# Patient Record
Sex: Female | Born: 1970 | Race: White | Hispanic: No | Marital: Married | Smoking: Former smoker
Health system: Southern US, Community
[De-identification: ages and names within clinical notes are randomized; demographics above are authoritative.]

## PROBLEM LIST (undated history)

## (undated) DIAGNOSIS — R51 Headache: Secondary | ICD-10-CM

## (undated) DIAGNOSIS — F419 Anxiety disorder, unspecified: Secondary | ICD-10-CM

## (undated) DIAGNOSIS — F32A Depression, unspecified: Secondary | ICD-10-CM

## (undated) DIAGNOSIS — F329 Major depressive disorder, single episode, unspecified: Secondary | ICD-10-CM

## (undated) HISTORY — DX: Major depressive disorder, single episode, unspecified: F32.9

## (undated) HISTORY — DX: Depression, unspecified: F32.A

## (undated) HISTORY — PX: TONSILLECTOMY: SUR1361

## (undated) HISTORY — DX: Headache: R51

## (undated) HISTORY — DX: Anxiety disorder, unspecified: F41.9

---

## 2006-10-09 ENCOUNTER — Inpatient Hospital Stay (HOSPITAL_COMMUNITY): Admission: AD | Admit: 2006-10-09 | Discharge: 2006-10-12 | Payer: Self-pay | Admitting: Obstetrics and Gynecology

## 2010-12-25 LAB — CBC
HCT: 31.2 — ABNORMAL LOW
HCT: 38.7
HCT: 38.9
MCHC: 34.1
MCHC: 34.4
MCV: 86.8
MCV: 85.6
MCV: 86.1
Platelets: 229
Platelets: 257
Platelets: 272
RBC: 4.49
RDW: 14.7 — ABNORMAL HIGH
WBC: 9.9
WBC: 10.1
WBC: 11.2 — ABNORMAL HIGH

## 2010-12-25 LAB — COMPREHENSIVE METABOLIC PANEL
AST: 18
Albumin: 2.2 — ABNORMAL LOW
Calcium: 8.5
Creatinine, Ser: 0.5
GFR calc Af Amer: >60

## 2011-06-07 ENCOUNTER — Other Ambulatory Visit: Payer: Self-pay | Admitting: Obstetrics and Gynecology

## 2011-06-07 DIAGNOSIS — R928 Other abnormal and inconclusive findings on diagnostic imaging of breast: Secondary | ICD-10-CM

## 2011-06-11 ENCOUNTER — Ambulatory Visit
Admission: RE | Admit: 2011-06-11 | Discharge: 2011-06-11 | Disposition: A | Discharge status: Home / Self Care | Payer: BC Managed Care – PPO | Source: Ambulatory Visit | Attending: Obstetrics and Gynecology | Admitting: Obstetrics and Gynecology

## 2011-06-11 DIAGNOSIS — R928 Other abnormal and inconclusive findings on diagnostic imaging of breast: Secondary | ICD-10-CM

## 2012-04-30 ENCOUNTER — Ambulatory Visit (INDEPENDENT_AMBULATORY_CARE_PROVIDER_SITE_OTHER): Payer: BC Managed Care – PPO | Admitting: Physician Assistant

## 2012-04-30 ENCOUNTER — Encounter: Payer: Self-pay | Admitting: Physician Assistant

## 2012-04-30 VITALS — BP 133/76 | HR 103 | Ht 63.25 in | Wt 150.0 lb

## 2012-04-30 DIAGNOSIS — D2371 Other benign neoplasm of skin of right lower limb, including hip: Secondary | ICD-10-CM

## 2012-04-30 DIAGNOSIS — R110 Nausea: Secondary | ICD-10-CM

## 2012-04-30 DIAGNOSIS — D2370 Other benign neoplasm of skin of unspecified lower limb, including hip: Secondary | ICD-10-CM

## 2012-04-30 DIAGNOSIS — R109 Unspecified abdominal pain: Secondary | ICD-10-CM

## 2012-04-30 DIAGNOSIS — F32A Depression, unspecified: Secondary | ICD-10-CM

## 2012-04-30 DIAGNOSIS — F411 Generalized anxiety disorder: Secondary | ICD-10-CM

## 2012-04-30 DIAGNOSIS — R1900 Intra-abdominal and pelvic swelling, mass and lump, unspecified site: Secondary | ICD-10-CM

## 2012-04-30 DIAGNOSIS — F3289 Other specified depressive episodes: Secondary | ICD-10-CM

## 2012-04-30 DIAGNOSIS — R195 Other fecal abnormalities: Secondary | ICD-10-CM

## 2012-04-30 LAB — CBC WITH DIFFERENTIAL/PLATELET
Eosinophils Absolute: 0.3 10*3/uL (ref 0.0–0.7)
Hemoglobin: 15.8 g/dL — ABNORMAL HIGH (ref 12.0–15.0)
Lymphs Abs: 2.3 10*3/uL (ref 0.7–4.0)
MCH: 29 pg (ref 26.0–34.0)
Monocytes Relative: 7 % (ref 3–12)
Neutro Abs: 6.8 10*3/uL (ref 1.7–7.7)
Neutrophils Relative %: 66 % (ref 43–77)
Platelets: 253 10*3/uL (ref 150–400)
RBC: 5.44 MIL/uL — ABNORMAL HIGH (ref 3.87–5.11)
WBC: 10.2 10*3/uL (ref 4.0–10.5)

## 2012-04-30 MED ORDER — ALPRAZOLAM 1 MG PO TABS: ORAL_TABLET | ORAL | Status: DC

## 2012-04-30 NOTE — Progress Notes (Signed)
Subjective:    Patient ID: Becky Gibson, female    DOB: [birth year], 42 y.o.   MRN: [id number]  HPI Patient is a new pt that it here to establish. PMH is positive for anxiety.  Anxiety is daily and constant but does not take anything because does not like those meds. She will take an xanax every now and then when anxiety gets really bad. She has a lot of marital stresses with a husband who is very controlling. She does not have daily depression but is aware that there are some days where all she can do is cry.   She has been having abdominal cramps, nausea, and lumps in her abdomen. She is very scared that it is cancer. She has noticed the lumps for over a year but has not sought treatment. She feels like they have increased in numbers. They are not painful to touch but sometimes tender. Denies any fever, chills, muscle aches, fatigue. She has had ongoing loose stools but no blood or mucus associated. Most things go right threw her. She does have a family hx of colon cancer.   She has a mole on her leg she would like looked at. She shaved over it a couple of months ago but has been there for many years. It has not changed in size or color. It does not bleed regularly. Never had hx of skin cancer.   Last pap feb 2013. Last mammogram 2013.  Family/social/surgical hx reviewed.      Review of Systems  Constitutional: Negative.   HENT: Negative.   Eyes: Negative.   Respiratory: Negative.   Cardiovascular: Negative.   Endocrine: Negative.   Genitourinary: Negative.   Musculoskeletal: Negative.   Allergic/Immunologic: Negative.   Neurological: Negative.   Hematological: Negative.        Objective:   Physical Exam  Constitutional: She is oriented to person, place, and time. She appears well-developed and well-nourished.  HENT:  Head: Normocephalic and atraumatic.  Eyes: Conjunctivae are normal.  Neck: Normal range of motion. Neck supple.  Cardiovascular: Regular rhythm and normal heart  sounds.   Tachycardia at 103.  Pulmonary/Chest: Effort normal and breath sounds normal.  Abdominal: Soft. Bowel sounds are normal. There is no tenderness.  Tiny marble sizes lumps all throughout abdomen, not painful to touch.     Lymphadenopathy:    She has no cervical adenopathy.  Neurological: She is alert and oriented to person, place, and time.  Skin: Skin is warm.  2mm by 1mm purpulish circular harden papule with dimple sign on top of right thigh anterior.  Psychiatric:  Anxious.          Assessment & Plan:  GAD/Depression- GAD-7 19. PHQ-9 was 5. Discussed treatment with SSRI. Pt declined at this time. I think loose stools and abdominal cramping might also be anxiety issue. I discussed benefit of treatment. She will consider at follow up appt in 6 weeks. Refilled xanax to use prn.   Dermatofibroma- discussed lump was benign. Could remove if bother her but best to leave alone. Usually form from trauma. Still watch if any color changes or size could always follow up.   Abdominal cramping/loose stools/abdominal lumps- Could def be lipomas throughout abdomin but with cramping/loose stools and anxiety about cancer think we should do CT. Discussed likely lipomas pt still very concerned. Will order a CBC to look at white blood count. Symptoms could also be IBS. Pt aware and if CT normal will consider treatment for anxiety/IBS. Would consider anti-nausea  but pt did not want meds.  Follow up in 6 weeks.

## 2012-04-30 NOTE — Patient Instructions (Addendum)
Will get CT scan. Call if not scheduled in 2 days.   Need to consider anti-depressant but will revisit in 1 month.

## 2012-05-02 ENCOUNTER — Encounter: Payer: Self-pay | Admitting: Physician Assistant

## 2012-05-02 DIAGNOSIS — F411 Generalized anxiety disorder: Secondary | ICD-10-CM | POA: Insufficient documentation

## 2012-05-05 ENCOUNTER — Telehealth: Payer: Self-pay | Admitting: *Deleted

## 2012-05-05 NOTE — Telephone Encounter (Signed)
CT abdomen/pelvis with contrast per [employer] no pre-cert required. Barry Dienes, LPN

## 2012-05-07 ENCOUNTER — Ambulatory Visit (INDEPENDENT_AMBULATORY_CARE_PROVIDER_SITE_OTHER): Payer: BC Managed Care – PPO

## 2012-05-07 MED ORDER — IOHEXOL 300 MG/ML  SOLN
100.0000 mL | Freq: Once | INTRAMUSCULAR | Status: AC | PRN
  Administered 2012-05-07: 100 mL via INTRAVENOUS

## 2012-05-10 ENCOUNTER — Other Ambulatory Visit: Payer: Self-pay | Admitting: Physician Assistant

## 2012-05-10 MED ORDER — SERTRALINE HCL 50 MG PO TABS: ORAL_TABLET | ORAL | Status: DC

## 2012-07-09 ENCOUNTER — Ambulatory Visit (INDEPENDENT_AMBULATORY_CARE_PROVIDER_SITE_OTHER): Payer: BC Managed Care – PPO | Admitting: Physician Assistant

## 2012-07-09 ENCOUNTER — Encounter: Payer: Self-pay | Admitting: Physician Assistant

## 2012-07-09 VITALS — BP 125/71 | HR 87 | Wt 161.0 lb

## 2012-07-09 DIAGNOSIS — Z131 Encounter for screening for diabetes mellitus: Secondary | ICD-10-CM

## 2012-07-09 DIAGNOSIS — E663 Overweight: Secondary | ICD-10-CM

## 2012-07-09 DIAGNOSIS — E0789 Other specified disorders of thyroid: Secondary | ICD-10-CM

## 2012-07-09 DIAGNOSIS — F411 Generalized anxiety disorder: Secondary | ICD-10-CM

## 2012-07-09 DIAGNOSIS — Z6825 Body mass index (BMI) 25.0-25.9, adult: Secondary | ICD-10-CM

## 2012-07-09 DIAGNOSIS — Z13220 Encounter for screening for lipoid disorders: Secondary | ICD-10-CM

## 2012-07-09 MED ORDER — PHENTERMINE HCL 37.5 MG PO CAPS: 37.5000 mg | ORAL_CAPSULE | ORAL | Status: DC

## 2012-07-09 MED ORDER — SERTRALINE HCL 50 MG PO TABS: ORAL_TABLET | ORAL | Status: DC

## 2012-07-09 NOTE — Progress Notes (Signed)
  Subjective:    Patient ID: Becky Gibson, female    DOB: 16-Jul-1970, 42 y.o.   MRN: [id number]  HPI Patient presents to the clinic to follow up on anxiety and GI concerns. She is doing 100 percent better today. Per patient she feels great and has no anxiety or depression. She even stopped smoking on march 5th. She graduates on may 8th. She is going through a divorce but on medications she is able to deal with all the stress. Her diarrhea and stomach bloating is 100 percent better.   She has noticed she has started to gain some weight back. In the past she was put on phentermine and she tolerated well. Denies any CP, palpiations, SOB or intolerance with appetitie suppressant.   Review of Systems     Objective:   Physical Exam  Constitutional: She is oriented to person, place, and time. She appears well-developed and well-nourished.  HENT:  Head: Normocephalic and atraumatic.  Eyes: Conjunctivae are normal.  Neck: Normal range of motion. Neck supple.  Bilateral fullness around thyroid. Per pt been present for many years.   Cardiovascular: Normal rate, regular rhythm and normal heart sounds.   Pulmonary/Chest: Effort normal and breath sounds normal. She has no wheezes.  Neurological: She is alert and oriented to person, place, and time.  Skin: Skin is warm and dry.  Psychiatric: She has a normal mood and affect. Her behavior is normal.          Assessment & Plan:  Anxiety- GAD-7 was 0. Refilled zoloft. Follow up in 6 months.   Thyroid fullness- will check TSH. Will follow up in 1 month as CPE. May consider ultrasound at that time.   Obesity- Will give phentermine. Recheck in 1 month.  Needs CPE. Gave lab slip.

## 2012-07-09 NOTE — Patient Instructions (Signed)
Needs CPE. Follow up in 1 month.   Continue zoloft.

## 2012-08-13 ENCOUNTER — Encounter: Payer: Self-pay | Admitting: Physician Assistant

## 2012-08-13 ENCOUNTER — Ambulatory Visit (INDEPENDENT_AMBULATORY_CARE_PROVIDER_SITE_OTHER): Payer: BC Managed Care – PPO | Admitting: Physician Assistant

## 2012-08-13 VITALS — BP 116/82 | HR 97 | Wt 160.0 lb

## 2012-08-13 DIAGNOSIS — R635 Abnormal weight gain: Secondary | ICD-10-CM

## 2012-08-13 DIAGNOSIS — L708 Other acne: Secondary | ICD-10-CM

## 2012-08-13 DIAGNOSIS — L709 Acne, unspecified: Secondary | ICD-10-CM | POA: Insufficient documentation

## 2012-08-13 MED ORDER — PHENTERMINE HCL 37.5 MG PO CAPS: 37.5000 mg | ORAL_CAPSULE | ORAL | Status: DC

## 2012-08-13 MED ORDER — DAPSONE 5 % EX GEL: 1.0000 "application " | Freq: Two times a day (BID) | CUTANEOUS | Status: DC

## 2012-08-13 NOTE — Progress Notes (Signed)
  Subjective:    Patient ID: Becky Gibson, female    DOB: [birth year], 42 y.o.   MRN: [id number]  HPI Patient is a 42 year old female who presents to the clinic for med refills.  Patient started phentermine last month. She has only lost 1 pound since starting medication. She admits to not exercising and not doing everything she could to diet. She still would have occasional soda and eats out fast food. She does feel like it has helped to decrease her appetite. She is still able to sleep and denies any chest pains, palpitations, headaches. She is concerned that is not working as well as it has in the past.  She also has some cystic acne spots that she would like some cream. She's used her daughter's aczone and has worked very well. She uses a combination of antibacterial and Dove soap. She has always had acne but seems to be able to control it with washing her face. Lately feels like she is having more cystic bumps. She admits to trying to pop cystic comedone herself.   Review of Systems     Objective:   Physical Exam  Constitutional: She is oriented to person, place, and time. She appears well-developed and well-nourished.  HENT:  Head: Normocephalic and atraumatic.  Cardiovascular: Normal rate, regular rhythm and normal heart sounds.   Pulmonary/Chest: Effort normal and breath sounds normal.  Neurological: She is alert and oriented to person, place, and time.  Skin:  3 comodomes that appear to be in the cycle of healing on chin and forehead.   Psychiatric: She has a normal mood and affect. Her behavior is normal.          Assessment & Plan:  Abnormal weight gain- Refilled phentermine. Discussed with patient that would help the most in combination with diet and exercise. Encouraged patient to count calories and start every day walking or exercise regimen. Followup in one month.  Acne- started aczone. Could be too expensive and there are other things to try. Made pt aware but since she  likes so much wanted to try this first. Continue to keep face clean. Look for triggers such as stress. Make sure wear sunscreen that doesn't clog pores.

## 2013-01-29 ENCOUNTER — Other Ambulatory Visit (HOSPITAL_COMMUNITY)
Admission: RE | Admit: 2013-01-29 | Discharge: 2013-01-29 | Disposition: A | Discharge status: Home / Self Care | Payer: BC Managed Care – PPO | Source: Ambulatory Visit | Attending: Family Medicine | Admitting: Family Medicine

## 2013-01-29 ENCOUNTER — Encounter: Payer: Self-pay | Admitting: Physician Assistant

## 2013-01-29 ENCOUNTER — Ambulatory Visit (INDEPENDENT_AMBULATORY_CARE_PROVIDER_SITE_OTHER): Payer: BC Managed Care – PPO | Admitting: Physician Assistant

## 2013-01-29 VITALS — BP 139/60 | HR 95 | Wt 179.0 lb

## 2013-01-29 DIAGNOSIS — Z1151 Encounter for screening for human papillomavirus (HPV): Secondary | ICD-10-CM | POA: Insufficient documentation

## 2013-01-29 DIAGNOSIS — Z01419 Encounter for gynecological examination (general) (routine) without abnormal findings: Secondary | ICD-10-CM | POA: Insufficient documentation

## 2013-01-29 DIAGNOSIS — R1011 Right upper quadrant pain: Secondary | ICD-10-CM

## 2013-01-29 DIAGNOSIS — Z0000 Encounter for general adult medical examination without abnormal findings: Secondary | ICD-10-CM

## 2013-01-29 DIAGNOSIS — Z23 Encounter for immunization: Secondary | ICD-10-CM

## 2013-01-29 DIAGNOSIS — R8290 Unspecified abnormal findings in urine: Secondary | ICD-10-CM

## 2013-01-29 DIAGNOSIS — Z131 Encounter for screening for diabetes mellitus: Secondary | ICD-10-CM

## 2013-01-29 DIAGNOSIS — R1012 Left upper quadrant pain: Secondary | ICD-10-CM

## 2013-01-29 DIAGNOSIS — E049 Nontoxic goiter, unspecified: Secondary | ICD-10-CM

## 2013-01-29 DIAGNOSIS — Z1239 Encounter for other screening for malignant neoplasm of breast: Secondary | ICD-10-CM

## 2013-01-29 DIAGNOSIS — R82998 Other abnormal findings in urine: Secondary | ICD-10-CM

## 2013-01-29 DIAGNOSIS — Z13220 Encounter for screening for lipoid disorders: Secondary | ICD-10-CM

## 2013-01-29 DIAGNOSIS — D171 Benign lipomatous neoplasm of skin and subcutaneous tissue of trunk: Secondary | ICD-10-CM

## 2013-01-29 LAB — POCT URINALYSIS DIPSTICK
Bilirubin, UA: NEGATIVE
Glucose, UA: NEGATIVE
Leukocytes, UA: NEGATIVE
Nitrite, UA: NEGATIVE
Protein, UA: NEGATIVE
Spec Grav, UA: 1.02
pH, UA: 7

## 2013-01-29 MED ORDER — OMEPRAZOLE 40 MG PO CPDR: 40.0000 mg | DELAYED_RELEASE_CAPSULE | Freq: Every day | ORAL | Status: DC

## 2013-01-29 MED ORDER — SERTRALINE HCL 50 MG PO TABS: ORAL_TABLET | ORAL | Status: DC

## 2013-01-29 MED ORDER — ALPRAZOLAM 1 MG PO TABS: ORAL_TABLET | ORAL | Status: DC

## 2013-01-29 NOTE — Progress Notes (Signed)
Subjective:     Becky Gibson is a 42 y.o. female and is here for a comprehensive physical exam. The patient reports no problems.  Anxiety- well controlled on Zoloft with occasional use of Xanax. Becky Gibson Zoloft helped her marriage together. She was in a dark place and consider maybe her husband and now they're together and better than ever.  Patient is having ongoing abdominal issues. She has had them on and off for the last 3 years. She has these small nodules in her abdomen that when touched become very painful. She was told there fatty tissue from weight loss. We did a CT in office and was negative. This is very concerning to her. Recently she's had a lot of burning in her upper GI area. When she sits down to the her stomach will burn to the point she cannot eat anymore. She is not aware of any foods that make this worse. She has not vomited. She has not taken anything to make better.  Patient does have an abnormal urine odor today. She denies any dysuria, urinary frequency or urinary urgency. Patient has not had a fever, chills, low back pain.   History   Social History  . Marital Status: Married    Spouse Name: N/A    Number of Children: N/A  . Years of Education: N/A   Occupational History  . Not on file.   Social History Main Topics  . Smoking status: Current Every Day Smoker  . Smokeless tobacco: Never Used  . Alcohol Use: No  . Drug Use: No  . Sexual Activity: Yes    Birth Control/ Protection: None   Other Topics Concern  . Not on file   Social History Narrative  . No narrative on file   Health Maintenance  Topic Date Due  . Tetanus/tdap  09/12/1987  . Influenza Vaccine  10/10/2012  . Pap Smear  01/30/2016    The following portions of the patient's history were reviewed and updated as appropriate: allergies, current medications, past family history, past medical history, past social history, past surgical history and problem list.  Review of Systems Pertinent  items are noted in HPI.   Objective:    BP 139/60  Pulse 95  Wt 179 lb (81.194 kg)  LMP 01/12/2013 General appearance: alert, cooperative and appears stated age Head: Normocephalic, without obvious abnormality, atraumatic Eyes: conjunctivae/corneas clear. PERRL, EOM's intact. Fundi benign. Ears: normal TM's and external ear canals both ears and bilateral TM scarring present. no erythema, blood or pus. Nose: Nares normal. Septum midline. Mucosa normal. No drainage or sinus tenderness. Throat: lips, mucosa, and tongue normal; teeth and gums normal Neck: no adenopathy, no carotid bruit, no JVD, supple, symmetrical, trachea midline and thyroid goiter/nodule felt on left side of trachea.  Back: symmetric, no curvature. ROM normal. No CVA tenderness. Lungs: clear to auscultation bilaterally Breasts: right breast normal appearance, masses, or tenderness. Left breast was very tender but no masses.  Heart: regular rate and rhythm, S1, S2 normal, no murmur, click, rub or gallop Abdomen: generalized tenderness over entire abdomen. More tenderness over upper quadrants to palpations. Small nodules consistent for lipomas noted on bilateral sides of abdomen.  Pelvic: cervix normal in appearance, external genitalia normal, no adnexal masses or tenderness, no cervical motion tenderness, rectovaginal septum normal, uterus normal size, shape, and consistency and vagina normal without discharge Extremities: extremities normal, atraumatic, no cyanosis or edema Pulses: 2+ and symmetric Skin: Skin color, texture, turgor normal. No rashes or lesions Lymph  nodes: Cervical, supraclavicular, and axillary nodes normal. Neurologic: Grossly normal    Assessment:    Healthy female exam.      Plan:    CPE- will order mammogram. Tdap given today. Flu shot denied. Pap done today and will call with results. Encouraged regular exercise for weight loss since gained weight over past 6 months. Vitamin D and calcium  recommended.   Upper abdominal pain/lipomas- Will test for h.pylori. Will start PPI for 4 weeks. Will get abdominal ultrasound for further evaluate nodules. Will get CBC to look for any WBC elevation. Discussed with patient I think nodules are lipomas. She is not convinced and wants further testing because they are so painful. Previous CT of abdomen done and normal.   Urine odor- UA negative for blood, nitrates, leukocytes. We'll send for culture. Reassured patient today. She has a symptom she was instructed to call office for followup.  Thyroid goiter, left side- Will get neck ultrasound and check TSH levels.   Anxiety- well controlled on zoloft and wants refill today.    See After Visit Summary for Counseling Recommendations

## 2013-01-29 NOTE — Patient Instructions (Addendum)
Will get ultrasound of thyroid and abdomen.    Keeping You Healthy  Get These Tests 1. Blood Pressure- Have your blood pressure checked once a year by your health care provider.  Normal blood pressure is 120/80. 2. Weight- Have your body mass index (BMI) calculated to screen for obesity.  BMI is measure of body fat based on height and weight.  You can also calculate your own BMI at https://www.west-esparza.com/. 3. Cholesterol- Have your cholesterol checked every 5 years starting at age 62 then yearly starting at age 54. 4. Chlamydia, HIV, and other sexually transmitted diseases- Get screened every year until age 53, then within three months of each new sexual provider. 5. Pap Smear- Every 1-3 years; discuss with your health care provider. 6. Mammogram- Every year starting at age 18  Take these medicines  Calcium with Vitamin D-Your body needs 1200 mg of Calcium each day and [phone] IU of Vitamin D daily.  Your body can only absorb 500 mg of Calcium at a time so Calcium must be taken in 2 or 3 divided doses throughout the day.  Multivitamin with folic acid- Once daily if it is possible for you to become pregnant.  Get these Immunizations  Gardasil-Series of three doses; prevents HPV related illness such as genital warts and cervical cancer.  Menactra-Single dose; prevents meningitis.  Tetanus shot- Every 10 years.  Flu shot-Every year.  Take these steps 1. Do not smoke-Your healthcare provider can help you quit.  For tips on how to quit go to www.smokefree.gov or call 1-800 QUITNOW. 2. Be physically active- Exercise 5 days a week for at least 30 minutes.  If you are not already physically active, start slow and gradually work up to 30 minutes of moderate physical activity.  Examples of moderate activity include walking briskly, dancing, swimming, bicycling, etc. 3. Breast Cancer- A self breast exam every month is important for early detection of breast cancer.  For more information and  instruction on self breast exams, ask your healthcare provider or [email or web address]. 4. Eat a healthy diet- Eat a variety of healthy foods such as fruits, vegetables, whole grains, low fat milk, low fat cheeses, yogurt, lean meats, poultry and fish, beans, nuts, tofu, etc.  For more information go to www. Thenutritionsource.org 5. Drink alcohol in moderation- Limit alcohol intake to one drink or less per day. Never drink and drive. 6. Depression- Your emotional health is as important as your physical health.  If you're feeling down or losing interest in things you normally enjoy please talk to your healthcare provider about being screened for depression. 7. Dental visit- Brush and floss your teeth twice daily; visit your dentist twice a year. 8. Eye doctor- Get an eye exam at least every 2 years. 9. Helmet use- Always wear a helmet when riding a bicycle, motorcycle, rollerblading or skateboarding. 10. Safe sex- If you may be exposed to sexually transmitted infections, use a condom. 11. Seat belts- Seat belts can save your live; always wear one. 12. Smoke/Carbon Monoxide detectors- These detectors need to be installed on the appropriate level of your home. Replace batteries at least once a year. 13. Skin cancer- When out in the sun please cover up and use sunscreen 15 SPF or higher. 14. Violence- If anyone is threatening or hurting you, please tell your healthcare provider.

## 2013-01-30 LAB — LIPID PANEL
HDL: 43 mg/dL (ref >39)
LDL Cholesterol: 112 mg/dL — ABNORMAL HIGH (ref 0–99)
Total CHOL/HDL Ratio: 4.4 Ratio

## 2013-01-30 LAB — COMPLETE METABOLIC PANEL WITH GFR
ALT: 16 U/L (ref 0–35)
Albumin: 4 g/dL (ref 3.5–5.2)
Alkaline Phosphatase: 62 U/L (ref 39–117)
CO2: 28 mEq/L (ref 19–32)
GFR, Est African American: >89 mL/min
Glucose, Bld: 76 mg/dL (ref 70–99)
Potassium: 4.2 mEq/L (ref 3.5–5.3)
Sodium: 140 mEq/L (ref 135–145)
Total Bilirubin: 0.3 mg/dL (ref 0.3–1.2)
Total Protein: 6.3 g/dL (ref 6.0–8.3)

## 2013-01-30 LAB — CBC WITH DIFFERENTIAL/PLATELET
Basophils Absolute: 0 10*3/uL (ref 0.0–0.1)
Basophils Relative: 1 % (ref 0–1)
Lymphocytes Relative: 29 % (ref 12–46)
MCHC: 33.3 g/dL (ref 30.0–36.0)
Neutro Abs: 5.2 10*3/uL (ref 1.7–7.7)
Neutrophils Relative %: 59 % (ref 43–77)
RDW: 14.1 % (ref 11.5–15.5)
WBC: 8.7 10*3/uL (ref 4.0–10.5)

## 2013-01-30 LAB — TSH: TSH: 0.631 u[IU]/mL (ref 0.350–4.500)

## 2013-01-30 LAB — LIPASE: Lipase: 14 U/L (ref 0–75)

## 2013-01-30 LAB — H. PYLORI ANTIBODY, IGG: H Pylori IgG: <0.4 {ISR}

## 2013-02-02 ENCOUNTER — Other Ambulatory Visit: Payer: Self-pay | Admitting: Physician Assistant

## 2013-02-02 ENCOUNTER — Telehealth: Payer: Self-pay | Admitting: *Deleted

## 2013-02-02 DIAGNOSIS — N6310 Unspecified lump in the right breast, unspecified quadrant: Secondary | ICD-10-CM

## 2013-02-02 LAB — URINE CULTURE: Colony Count: >=100000

## 2013-02-02 MED ORDER — CIPROFLOXACIN HCL 500 MG PO TABS: 500.0000 mg | ORAL_TABLET | Freq: Two times a day (BID) | ORAL | Status: DC

## 2013-02-03 ENCOUNTER — Ambulatory Visit: Payer: BC Managed Care – PPO

## 2013-02-03 ENCOUNTER — Ambulatory Visit (INDEPENDENT_AMBULATORY_CARE_PROVIDER_SITE_OTHER): Payer: BC Managed Care – PPO

## 2013-02-03 DIAGNOSIS — E041 Nontoxic single thyroid nodule: Secondary | ICD-10-CM

## 2013-02-03 DIAGNOSIS — R1011 Right upper quadrant pain: Secondary | ICD-10-CM

## 2013-02-03 DIAGNOSIS — D171 Benign lipomatous neoplasm of skin and subcutaneous tissue of trunk: Secondary | ICD-10-CM

## 2013-02-03 DIAGNOSIS — E049 Nontoxic goiter, unspecified: Secondary | ICD-10-CM

## 2013-02-04 ENCOUNTER — Other Ambulatory Visit: Payer: Self-pay | Admitting: Physician Assistant

## 2013-02-04 ENCOUNTER — Encounter: Payer: Self-pay | Admitting: Physician Assistant

## 2013-02-04 DIAGNOSIS — E041 Nontoxic single thyroid nodule: Secondary | ICD-10-CM | POA: Insufficient documentation

## 2013-02-04 DIAGNOSIS — E010 Iodine-deficiency related diffuse (endemic) goiter: Secondary | ICD-10-CM

## 2013-02-09 ENCOUNTER — Other Ambulatory Visit: Payer: Self-pay | Admitting: Physician Assistant

## 2013-02-09 DIAGNOSIS — E042 Nontoxic multinodular goiter: Secondary | ICD-10-CM

## 2013-02-12 ENCOUNTER — Ambulatory Visit
Admission: RE | Admit: 2013-02-12 | Discharge: 2013-02-12 | Disposition: A | Discharge status: Home / Self Care | Payer: BC Managed Care – PPO | Source: Ambulatory Visit | Attending: Physician Assistant | Admitting: Physician Assistant

## 2013-02-12 ENCOUNTER — Other Ambulatory Visit: Payer: Self-pay | Admitting: Physician Assistant

## 2013-02-12 ENCOUNTER — Other Ambulatory Visit (HOSPITAL_COMMUNITY)
Admission: RE | Admit: 2013-02-12 | Discharge: 2013-02-12 | Disposition: A | Discharge status: Home / Self Care | Payer: BC Managed Care – PPO | Source: Ambulatory Visit | Attending: Interventional Radiology | Admitting: Interventional Radiology

## 2013-02-12 DIAGNOSIS — E042 Nontoxic multinodular goiter: Secondary | ICD-10-CM

## 2013-02-12 DIAGNOSIS — E041 Nontoxic single thyroid nodule: Secondary | ICD-10-CM | POA: Insufficient documentation

## 2013-02-16 ENCOUNTER — Other Ambulatory Visit: Payer: Self-pay | Admitting: Physician Assistant

## 2013-02-16 DIAGNOSIS — D497 Neoplasm of unspecified behavior of endocrine glands and other parts of nervous system: Secondary | ICD-10-CM

## 2013-02-16 DIAGNOSIS — E041 Nontoxic single thyroid nodule: Secondary | ICD-10-CM

## 2013-02-17 ENCOUNTER — Telehealth: Payer: Self-pay | Admitting: *Deleted

## 2013-02-17 NOTE — Telephone Encounter (Signed)
Pt called & requested her endocrinology referral be sent to Dr. Dorisann Frames in [place].  Her sister goes there & this dr has all the family hx. Lamont Snowball all the info to send new referral. Just an FYI.

## 2013-02-20 ENCOUNTER — Other Ambulatory Visit: Payer: BC Managed Care – PPO

## 2013-03-02 ENCOUNTER — Other Ambulatory Visit: Payer: BC Managed Care – PPO

## 2013-04-01 ENCOUNTER — Telehealth: Payer: Self-pay | Admitting: *Deleted

## 2013-04-01 MED ORDER — METAXALONE 800 MG PO TABS: 800.0000 mg | ORAL_TABLET | Freq: Three times a day (TID) | ORAL | Status: DC

## 2013-04-01 NOTE — Telephone Encounter (Signed)
Pt calls today asking if you would be willing to rx skelaxin 800mg  1 tab TID & Norco 7.25-325mg  q6 prn for her back pain, muscle spasms daily, groin pain, muscle pains in her upper legs. She states that back in sept she went to the ED & was rx these.

## 2013-04-01 NOTE — Telephone Encounter (Signed)
Ok for skelaxin 800mg  TID #90 no refills. I cannot send narcotic without appt and evaluation. Will need to make appt.

## 2013-04-01 NOTE — Telephone Encounter (Signed)
Notified pt & sent skelaxin 800mg  #90 to [facility].  She said she is waiting to hear about her nuclear scan on her thyroid.

## 2013-04-06 ENCOUNTER — Other Ambulatory Visit (HOSPITAL_COMMUNITY): Payer: Self-pay | Admitting: Endocrinology

## 2013-04-06 DIAGNOSIS — E041 Nontoxic single thyroid nodule: Secondary | ICD-10-CM

## 2013-04-23 ENCOUNTER — Ambulatory Visit (HOSPITAL_COMMUNITY)
Admission: RE | Admit: 2013-04-23 | Discharge: 2013-04-23 | Disposition: A | Discharge status: Home / Self Care | Payer: BC Managed Care – PPO | Source: Ambulatory Visit | Attending: Endocrinology | Admitting: Endocrinology

## 2013-04-23 DIAGNOSIS — E042 Nontoxic multinodular goiter: Secondary | ICD-10-CM | POA: Insufficient documentation

## 2013-04-23 DIAGNOSIS — E041 Nontoxic single thyroid nodule: Secondary | ICD-10-CM

## 2013-04-23 MED ORDER — SODIUM PERTECHNETATE TC 99M INJECTION
10.6000 | Freq: Once | INTRAVENOUS | Status: AC | PRN
  Administered 2013-04-23: 11 via INTRAVENOUS

## 2013-04-30 ENCOUNTER — Encounter (HOSPITAL_COMMUNITY): Payer: BC Managed Care – PPO

## 2013-05-01 ENCOUNTER — Encounter (HOSPITAL_COMMUNITY): Payer: BC Managed Care – PPO

## 2013-09-16 ENCOUNTER — Ambulatory Visit (INDEPENDENT_AMBULATORY_CARE_PROVIDER_SITE_OTHER): Payer: BC Managed Care – PPO | Admitting: Physician Assistant

## 2013-09-16 ENCOUNTER — Encounter: Payer: Self-pay | Admitting: Physician Assistant

## 2013-09-16 ENCOUNTER — Ambulatory Visit (INDEPENDENT_AMBULATORY_CARE_PROVIDER_SITE_OTHER): Payer: BC Managed Care – PPO

## 2013-09-16 VITALS — BP 120/79 | HR 96 | Ht 63.25 in | Wt 191.0 lb

## 2013-09-16 DIAGNOSIS — M5137 Other intervertebral disc degeneration, lumbosacral region: Secondary | ICD-10-CM

## 2013-09-16 DIAGNOSIS — R109 Unspecified abdominal pain: Secondary | ICD-10-CM

## 2013-09-16 DIAGNOSIS — M545 Low back pain, unspecified: Secondary | ICD-10-CM

## 2013-09-16 DIAGNOSIS — H539 Unspecified visual disturbance: Secondary | ICD-10-CM

## 2013-09-16 DIAGNOSIS — G8929 Other chronic pain: Secondary | ICD-10-CM

## 2013-09-16 DIAGNOSIS — D1739 Benign lipomatous neoplasm of skin and subcutaneous tissue of other sites: Secondary | ICD-10-CM

## 2013-09-16 DIAGNOSIS — R609 Edema, unspecified: Secondary | ICD-10-CM

## 2013-09-16 DIAGNOSIS — D171 Benign lipomatous neoplasm of skin and subcutaneous tissue of trunk: Secondary | ICD-10-CM

## 2013-09-16 DIAGNOSIS — M6240 Contracture of muscle, unspecified site: Secondary | ICD-10-CM

## 2013-09-16 DIAGNOSIS — F411 Generalized anxiety disorder: Secondary | ICD-10-CM

## 2013-09-16 DIAGNOSIS — M62838 Other muscle spasm: Secondary | ICD-10-CM

## 2013-09-16 DIAGNOSIS — R600 Localized edema: Secondary | ICD-10-CM

## 2013-09-16 MED ORDER — SERTRALINE HCL 100 MG PO TABS: 100.0000 mg | ORAL_TABLET | Freq: Every day | ORAL | Status: DC

## 2013-09-16 MED ORDER — HYDROCHLOROTHIAZIDE 12.5 MG PO TABS: ORAL_TABLET | ORAL | Status: DC

## 2013-09-16 MED ORDER — OMEPRAZOLE 40 MG PO CPDR: 40.0000 mg | DELAYED_RELEASE_CAPSULE | Freq: Every day | ORAL | Status: DC

## 2013-09-16 MED ORDER — ALPRAZOLAM 1 MG PO TABS: ORAL_TABLET | ORAL | Status: DC

## 2013-09-16 NOTE — Patient Instructions (Signed)
MRI of head.  Get lumbar xray of spine.  Increased zoloft to 100mg  daily.  HCTZ 1-2 tablets as needed for swelling.

## 2013-09-16 NOTE — Progress Notes (Signed)
Subjective:    Patient ID: Becky Gibson, female    DOB: 05/07/1970, 43 y.o.   MRN: [id number]  HPI Pt is a 43 yo female she presents to the clinic desperate to find answers for her ongoing symptoms. Symptoms have been ongoing since last September. She is going to the emergency room a few times and has seen a neurologist once. The neurologist ordered an MRI but never had done because they were asking for the money up front. She has had numerous diagnosis of restless leg syndrome, lipoma, thyroid nodules. She has had 2 thyroid nodules evaluated by endocrinologist and was given a clean bill of health for one year. Her symptoms are ongoing and seems to be worsening. She has pain all over her body. If she noticed any muscle it hurts and aches. Most of her pain is localized in her mid to low back and radiates around into her abdomen. She has small nodules in her abdomen that are very painful. These have been evaluated by myself and general surgery entire or lipomas. She has had a CT scan of abdomen and pelvis in 2014/March and negative for any causes of abdominal pain. She has had headaches almost every day. She has muscle contractions in her legs that she feels like "a baby is in her leg and trying to get out". She has been given Valium which helps some, ibuprofen and Skelaxin which does nothing. She does feel like her vision is becoming a little blurry. She does not want pain medication but wants to find out what is going on. She does not want to go back to the neurologist in [facility] but wants to be in network with someone in:. Her chronic symptoms are making her anxiety much worse. She went to ER on July 4th with panic attack. Her mother had ovarian cancer. She is scared something really bad is going on.    Review of Systems  All other systems reviewed and are negative.      Objective:   Physical Exam  Constitutional: She is oriented to person, place, and time. She appears well-developed and  well-nourished.  HENT:  Head: Normocephalic and atraumatic.  Cardiovascular: Normal rate, regular rhythm and normal heart sounds.   Pulmonary/Chest: Effort normal and breath sounds normal. She has no wheezes.  Neurological: She is alert and oriented to person, place, and time.  Skin: Skin is dry.  Psychiatric: She has a normal mood and affect. Her behavior is normal.  Upset and desperate to find out what is going on.           Assessment & Plan:  Anxiety/panic attacks- we did increase Zoloft today to 100 mg daily. Discussed with patient she could use Xanax for anxiety and panic attacks. Do not take with valium. Valium helps better with muscle spasm.   Muscle contractions- continue to use valium as needed. Discussed with pt there are other medications we could try such as gabapentin or requip for suspected RLS. Pt does not want to try any other medications. She prefers to undergo is going on and then proceed with medication.  Low back pain- patient has not had any imaging done of her back. Will get lumbar x-ray to evaluate for any disc compression or spinal abnormalities. Certainly continue to take ibuprofen up to 800 mg up to 3 times a day for ongoing back pain and discomfort.   Abdominal pain/lipoma-reassured patient that all of our exams only to lipoma and she's had a CT scan  done. At this time I do not want to proceed with any other imaging. Certainly she can consider a second opinion by another general surgeon. But it is unlikely they will take him out because they're so many in number.   Lower extremity edema-this only occurs periodically. She was given HCTZ to use 1-2 tabs as needed for lower extremity swelling. Discussed with patient this could be related to long periods of sitting or standing or salt intake. Elevation of feet is the best treatment for the leg swelling.  Overall discussed with patient that I am suspicious of fiber myalgia. However I feel like she needs a full  neurological/rheumatology will workup. I would like to refer her to a: Neurologist. Some of her symptoms sound suspicious for multiple sclerosis. I feel that she needs an evaluation before we go down the fibromyalgia pathway. She is on Zoloft which is 1 treatment for fibromyalgia like pain. She will continue on this and see if that helps with any of her symptoms.

## 2013-09-17 ENCOUNTER — Other Ambulatory Visit: Payer: Self-pay | Admitting: *Deleted

## 2013-09-17 DIAGNOSIS — H539 Unspecified visual disturbance: Secondary | ICD-10-CM | POA: Insufficient documentation

## 2013-09-17 DIAGNOSIS — R109 Unspecified abdominal pain: Secondary | ICD-10-CM | POA: Insufficient documentation

## 2013-09-17 DIAGNOSIS — G8929 Other chronic pain: Secondary | ICD-10-CM | POA: Insufficient documentation

## 2013-09-17 DIAGNOSIS — M545 Low back pain, unspecified: Secondary | ICD-10-CM | POA: Insufficient documentation

## 2013-09-17 MED ORDER — DIAZEPAM 5 MG PO TABS: 5.0000 mg | ORAL_TABLET | Freq: Three times a day (TID) | ORAL | Status: DC | PRN

## 2013-09-18 ENCOUNTER — Telehealth: Payer: Self-pay | Admitting: *Deleted

## 2013-09-18 NOTE — Telephone Encounter (Signed)
No auth required for MRI [id number] as per Cierra @ [facility]. Margette Fast, CMA

## 2013-09-26 ENCOUNTER — Ambulatory Visit (HOSPITAL_BASED_OUTPATIENT_CLINIC_OR_DEPARTMENT_OTHER)
Admission: RE | Admit: 2013-09-26 | Discharge: 2013-09-26 | Disposition: A | Discharge status: Home / Self Care | Payer: BC Managed Care – PPO | Source: Ambulatory Visit | Attending: Physician Assistant | Admitting: Physician Assistant

## 2013-09-26 DIAGNOSIS — M545 Low back pain, unspecified: Secondary | ICD-10-CM | POA: Insufficient documentation

## 2013-09-26 DIAGNOSIS — H539 Unspecified visual disturbance: Secondary | ICD-10-CM | POA: Insufficient documentation

## 2013-09-26 DIAGNOSIS — G8929 Other chronic pain: Secondary | ICD-10-CM | POA: Insufficient documentation

## 2013-09-26 DIAGNOSIS — J320 Chronic maxillary sinusitis: Secondary | ICD-10-CM | POA: Insufficient documentation

## 2013-09-26 DIAGNOSIS — M6240 Contracture of muscle, unspecified site: Secondary | ICD-10-CM

## 2013-09-26 DIAGNOSIS — M62838 Other muscle spasm: Secondary | ICD-10-CM | POA: Insufficient documentation

## 2013-09-26 MED ORDER — GADOBENATE DIMEGLUMINE 529 MG/ML IV SOLN: 15.0000 mL | Freq: Once | INTRAVENOUS | Status: AC | PRN

## 2013-09-28 ENCOUNTER — Other Ambulatory Visit: Payer: Self-pay | Admitting: Physician Assistant

## 2013-09-28 MED ORDER — AZITHROMYCIN 250 MG PO TABS: ORAL_TABLET | ORAL | Status: DC

## 2013-09-29 ENCOUNTER — Other Ambulatory Visit: Payer: Self-pay | Admitting: Physician Assistant

## 2013-09-29 MED ORDER — GABAPENTIN 300 MG PO CAPS: ORAL_CAPSULE | ORAL | Status: DC

## 2013-10-14 ENCOUNTER — Ambulatory Visit (INDEPENDENT_AMBULATORY_CARE_PROVIDER_SITE_OTHER): Payer: BC Managed Care – PPO | Admitting: Neurology

## 2013-10-14 ENCOUNTER — Ambulatory Visit: Payer: BC Managed Care – PPO | Admitting: Neurology

## 2013-10-14 ENCOUNTER — Encounter: Payer: Self-pay | Admitting: Neurology

## 2013-10-14 VITALS — BP 129/83 | HR 92 | Ht 63.0 in | Wt 181.4 lb

## 2013-10-14 DIAGNOSIS — R252 Cramp and spasm: Secondary | ICD-10-CM

## 2013-10-14 DIAGNOSIS — IMO0001 Reserved for inherently not codable concepts without codable children: Secondary | ICD-10-CM

## 2013-10-14 DIAGNOSIS — M797 Fibromyalgia: Secondary | ICD-10-CM

## 2013-10-14 MED ORDER — PREGABALIN 75 MG PO CAPS: 75.0000 mg | ORAL_CAPSULE | Freq: Three times a day (TID) | ORAL | Status: DC

## 2013-10-14 NOTE — Patient Instructions (Addendum)
I had a long discussion with the patient and her sister regarding a multifocal symptoms of muscle cramps, aches and pains and discussed my neurological exam, differential diagnosis, plan for evaluation, treatment and answered questions. I reassured the patient that her clinical history, examination and imaging studies do not support a diagnosis of multiple sclerosis which she was concerned about. I suggest a trial of Lyrica 75mg  three times daily increase if tolerated without side effects. Discontinue gabapentin as it is not working. Check EMG nerve conduction study to rule out any underlying neuromuscular disorders. Check ANA panel, Lyme titer, anti GAD antibodies,angiotensin-converting enzyme levels. Return for followup in 2 months or call earlier if necessary.

## 2013-10-14 NOTE — Progress Notes (Signed)
[facility] [address] [place] [address] [phone]       OFFICE CONSULT NOTE  Becky. Becky Gibson Date of Birth:  [birth year] Medical Record Number:  [id number]   Referring MD:  Iran Planas, PA-C  Reason for Referral:  Chronic pain and spasms  HPI: Becky Gibson is a37 year Caucasian lady who is accompanied today by her sister. She chronic patient's, muscle spasms and cramps since June of 2014. This began initially with pain in the right lthigh followed by a left thigh intermittently but hasn't got worse gradually and now is present daily. She also complains of spasms in her muscles of abdomen on the right as well as left shoulder and occasional twitchings along the left eye. The pain may   change location and may be of variable type from spasms, cramps, tightening her muscles or burning in quality. This seems to affect her quality of life significantly and she is unable to work or move around due to pains. She has tried a variety of medications without success including Skelaxin and Flexeril. She currently takes Valium which seems to help a bit. She was seen by a neurologist in [place] in November of 2014 but I do not have records to the patient could not followup because of change in insurance. She had MRI scan of the brain done on 09/26/13 and have personally reviewed the films and it appears normal. She also had Xrays of the lumbar spine on 09/16/13 which showed only minor degenerative changes without significant compression.  ROS:   14 system review of systems is positive for weight gain, fatigue, leg swelling, snoring, increased thirst, joint pain and swelling, muscle cramps and aching, headache, numbness, weakness, sleepiness, snoring, restless legs, decreased energy and disinterest in activities.  PMH:  Past Medical History  Diagnosis Date  . Anxiety   . Headache(784.0)   . Anxiety   . Depression     Social History:  History   Social History  .  Marital Status: Married    Spouse Name: N/A    Number of Children: 3  . Years of Education: college   Occupational History  . n/a    Social History Main Topics  . Smoking status: Current Every Day Smoker  . Smokeless tobacco: Never Used  . Alcohol Use: No  . Drug Use: No  . Sexual Activity: Yes    Birth Control/ Protection: None   Other Topics Concern  . Not on file   Social History Narrative   Patient lives at home with her family.   Patient is right handed   Patient  Drinks coffee daily    Medications:   Current Outpatient Prescriptions on File Prior to Visit  Medication Sig Dispense Refill  . ALPRAZolam (XANAX) 1 MG tablet Take 1/2 as needed for anxiety up to twice a day.  30 tablet  5  . diazepam (VALIUM) 5 MG tablet Take 1 tablet (5 mg total) by mouth every 8 (eight) hours as needed.  30 tablet  0  . gabapentin (NEURONTIN) 300 MG capsule Take 1 tablet at bedtime for 7 days and then increase to 1 tablet twice a day.  60 capsule  3  . hydrochlorothiazide (HYDRODIURIL) 12.5 MG tablet Take 1-2 tabs as needed for lower leg edema.  60 tablet  3  . omeprazole (PRILOSEC) 40 MG capsule Take 1 capsule (40 mg total) by mouth daily. Take 30 minutes before breakfast.  30 capsule  2  . sertraline (ZOLOFT)  100 MG tablet Take 1 tablet (100 mg total) by mouth daily.  30 tablet  2   No current facility-administered medications on file prior to visit.    Allergies:   Allergies  Allergen Reactions  . Penicillins     Physical Exam General: Obese middle-age Caucasian lady, seated, in no evident distress Head: head normocephalic and atraumatic. Orohparynx benign Neck: supple with no carotid or supraclavicular bruits Cardiovascular: regular rate and rhythm, no murmurs Musculoskeletal: no deformity Skin:  no rash/petichiae Vascular:  Normal pulses all extremities Filed Vitals:   10/14/13 1359  BP: 129/83  Pulse: 92    Neurologic Exam Mental Status: Awake and fully alert.  Oriented to place and time. Recent and remote memory intact. Attention span, concentration and fund of knowledge appropriate. Mood and affect appropriate.  Cranial Nerves: Fundoscopic exam reveals sharp disc margins. Pupils equal, briskly reactive to light. Extraocular movements full without nystagmus. Visual fields full to confrontation. Hearing intact. Facial sensation intact. Face, tongue, palate moves normally and symmetrically.  Motor: Normal bulk and tone. Normal strength in all tested extremity muscles. Sensory.: intact to touch and pinprick and vibratory sensation.. slight hyperesthesia to touch and pinprick over both feet and low back and left hip. Marked allodynia over low back and left hip as well. Coordination: Rapid alternating movements normal in all extremities. Finger-to-nose and heel-to-shin performed accurately bilaterally. Gait and Station: Arises from chair without difficulty. Stance is normal. Gait demonstrates normal stride length and balance . Able to heel, toe and tandem walk without difficulty.  Reflexes: 1+ and symmetric. Toes downgoing.   ASSESSMENT: 51 year Caucasian lady with multifocal symptoms of chronic pain, muscle spasms, cramps with a nonfocal neurological exam of unclear etiology. Probably fibromyalgia but neuromuscular conditions like ALS or stiff person syndrome need to be ruled out.    PLAN: I had a long discussion with the patient and her sister regarding a multifocal symptoms of muscle cramps, aches and pains and discussed my neurological exam, differential diagnosis, plan for evaluation, treatment and answered questions. I reassured the patient that her clinical history, examination and imaging studies do not support a diagnosis of multiple sclerosis which she was concerned about. I suggest a trial of Lyrica 75 mg three times daily increase if tolerated without side effects. Discontinue gabapentin as it is not working. Check EMG nerve conduction study to rule  out any underlying neuromuscular disorders. Check ANA panel, Lyme titer, anti GAD antibodies,angiotensin-converting enzyme levels. Return for followup in 2 months or call earlier if necessary.    Note: This document was prepared with digital dictation and possible smart phrase technology. Any transcriptional errors that result from this process are unintentional.

## 2013-10-15 LAB — ANGIOTENSIN CONVERTING ENZYME: ANGIO CONVERT ENZYME: 55 U/L (ref 14–82)

## 2013-10-15 LAB — ANA W/REFLEX IF POSITIVE: ANA: NEGATIVE

## 2013-10-15 LAB — GAD-65 AUTOANTIBODY: Glutamic Acid Decarb Ab: <1 U/mL (ref 0.0–1.5)

## 2013-10-15 LAB — LYME, TOTAL AB TEST/REFLEX: Lyme IgG/IgM Ab: <0.91 {ISR} (ref 0.00–0.90)

## 2013-10-16 ENCOUNTER — Telehealth: Payer: Self-pay | Admitting: *Deleted

## 2013-10-16 MED ORDER — DIAZEPAM 5 MG PO TABS: 5.0000 mg | ORAL_TABLET | Freq: Three times a day (TID) | ORAL | Status: DC | PRN

## 2013-10-16 NOTE — Telephone Encounter (Signed)
Pt called to let you know she saw the neurologist on Wed.  She has no clinical signs of MS, he has ruled that out.  However, she said that if told her if he were to make a dx, it would be fibromyalgia.  She had no reflexes in her right leg.  She is going to have a nerve conduction velocity on 8/26 to rule out any underlying neurological diseases. He has added Lyrica 75mg  tid for 8 weeks and dc'd her gabapentin, skelaxin, and she is not going to take the xanax anymore.  I've already taken them off of her med list. He told her he was ok with her taking the valium but that she needed to get that from you so I've already filled that as well.  She started the Lyrica on Wed night and was able to walk around [employer] for hours with her sister yesterday without being in tears.  She just wanted you have an update.

## 2013-10-16 NOTE — Telephone Encounter (Signed)
Awesome glad you are doing better with lyrica. That certainly confirms what we were talking about at last visit with fibromyaglia. Thank you for the update.

## 2013-10-19 NOTE — Progress Notes (Signed)
Inform patient that all blood work was normal

## 2013-10-20 NOTE — Progress Notes (Signed)
I called pt and relayed that her lab work results came back as normal.  She stated that the lyrica is "changing her life".  She has her NCS/EMG on 11-04-13.  Her f/u appt was made 05-2014.  (as f/u) to initial appt.  See sooner.?

## 2013-11-04 ENCOUNTER — Encounter (INDEPENDENT_AMBULATORY_CARE_PROVIDER_SITE_OTHER): Payer: Self-pay

## 2013-11-04 ENCOUNTER — Ambulatory Visit (INDEPENDENT_AMBULATORY_CARE_PROVIDER_SITE_OTHER): Payer: BC Managed Care – PPO | Admitting: Neurology

## 2013-11-04 ENCOUNTER — Telehealth: Payer: Self-pay | Admitting: *Deleted

## 2013-11-04 DIAGNOSIS — IMO0001 Reserved for inherently not codable concepts without codable children: Secondary | ICD-10-CM

## 2013-11-04 DIAGNOSIS — R252 Cramp and spasm: Secondary | ICD-10-CM

## 2013-11-04 DIAGNOSIS — Z0289 Encounter for other administrative examinations: Secondary | ICD-10-CM

## 2013-11-04 DIAGNOSIS — M797 Fibromyalgia: Secondary | ICD-10-CM

## 2013-11-04 NOTE — Procedures (Signed)
     HISTORY:  Becky Gibson is a 43 year old patient with a one-year history of diffuse neuromuscular discomfort, and muscle spasm. The patient is being evaluated for her chronic pain.  NERVE CONDUCTION STUDIES:  Nerve conduction studies were performed on the right upper extremity. The distal motor latencies and motor amplitudes for the median and ulnar nerves were within normal limits. The F wave latencies and nerve conduction velocities for these nerves were also normal. The sensory latencies for the median and ulnar nerves were normal.  Nerve conduction studies were performed on the right lower extremity. The distal motor latencies and motor amplitudes for the peroneal and posterior tibial nerves were within normal limits. The nerve conduction velocities for these nerves were also normal. The sensory latency for the peroneal nerve was within normal limits.   EMG STUDIES:  EMG study was performed on the right lower extremity:  The tibialis anterior muscle reveals 2 to 4K motor units with full recruitment. No fibrillations or positive waves were seen. The peroneus tertius muscle reveals 2 to 4K motor units with full recruitment. No fibrillations or positive waves were seen. The medial gastrocnemius muscle reveals 1 to 3K motor units with full recruitment. No fibrillations or positive waves were seen. The vastus lateralis muscle reveals 2 to 4K motor units with full recruitment. No fibrillations or positive waves were seen. The iliopsoas muscle reveals 2 to 4K motor units with full recruitment. No fibrillations or positive waves were seen. The biceps femoris muscle (long head) reveals 2 to 4K motor units with full recruitment. No fibrillations or positive waves were seen. The lumbosacral paraspinal muscles were tested at 3 levels, and revealed no abnormalities of insertional activity at all 3 levels tested. There was good relaxation.   IMPRESSION:  Nerve conduction studies done on the  right upper and right lower extremities were unremarkable. There is no clear evidence of a peripheral neuropathy. EMG evaluation of the right lower extremity was normal, without evidence of a lumbosacral radiculopathy or evidence of a myopathic disorder.  Jill Alexanders MD 11/04/2013 1:50 PM  [facility] [address] [place], [place] [address]  Phone [phone] Fax [phone]

## 2013-11-04 NOTE — Telephone Encounter (Signed)
See result note.  

## 2013-11-04 NOTE — Progress Notes (Signed)
Quick Note:  I called pt and relayed the lab results as normal. She states she had Boyce/EMG today and this is normal. She continues to have lower back pain (sacral area). Does she increase lyrica (now on 75mg  po tid) or is there something else. Cause of this back pain? Diagnosis of fibromyalgia? Can we make f/u using NP slot? She has hx of 7 yrs ago epidural that did not take, could this be cause of back pain? Many questions. ______

## 2013-11-04 NOTE — Telephone Encounter (Signed)
I called home # [id number] for her that calling regarding lab results.

## 2013-11-04 NOTE — Progress Notes (Signed)
Yes we can use NP for f/u and she can increase Lyrica to 150 mg TID if tolerated.

## 2013-11-09 ENCOUNTER — Ambulatory Visit: Payer: Self-pay | Admitting: Neurology

## 2013-11-24 ENCOUNTER — Ambulatory Visit: Payer: BC Managed Care – PPO | Admitting: Neurology

## 2013-12-31 ENCOUNTER — Other Ambulatory Visit: Payer: Self-pay | Admitting: Physician Assistant

## 2014-01-27 ENCOUNTER — Ambulatory Visit (INDEPENDENT_AMBULATORY_CARE_PROVIDER_SITE_OTHER): Payer: BC Managed Care – PPO | Admitting: Physician Assistant

## 2014-01-27 ENCOUNTER — Encounter: Payer: Self-pay | Admitting: Physician Assistant

## 2014-01-27 VITALS — BP 139/79 | HR 105 | Ht 63.0 in | Wt 193.0 lb

## 2014-01-27 DIAGNOSIS — M5136 Other intervertebral disc degeneration, lumbar region: Secondary | ICD-10-CM

## 2014-01-27 DIAGNOSIS — M5416 Radiculopathy, lumbar region: Secondary | ICD-10-CM | POA: Diagnosis not present

## 2014-01-27 DIAGNOSIS — M62830 Muscle spasm of back: Secondary | ICD-10-CM

## 2014-01-27 MED ORDER — CYCLOBENZAPRINE HCL 10 MG PO TABS: ORAL_TABLET | ORAL | Status: DC

## 2014-01-27 MED ORDER — METHYLPREDNISOLONE SODIUM SUCC 125 MG IJ SOLR
125.0000 mg | Freq: Once | INTRAMUSCULAR | Status: AC
  Administered 2014-01-27: 125 mg via INTRAMUSCULAR

## 2014-01-27 MED ORDER — KETOROLAC TROMETHAMINE 30 MG/ML IJ SOLN
30.0000 mg | Freq: Once | INTRAMUSCULAR | Status: AC
  Administered 2014-01-27: 30 mg via INTRAMUSCULAR

## 2014-01-27 MED ORDER — MELOXICAM 15 MG PO TABS: 15.0000 mg | ORAL_TABLET | Freq: Every day | ORAL | Status: DC

## 2014-01-27 MED ORDER — PREDNISONE (PAK) 10 MG PO TABS: ORAL_TABLET | ORAL | Status: DC

## 2014-01-27 MED ORDER — TRAMADOL HCL 50 MG PO TABS: 50.0000 mg | ORAL_TABLET | Freq: Three times a day (TID) | ORAL | Status: DC | PRN

## 2014-01-27 NOTE — Patient Instructions (Addendum)
MRI.  Mobic daily.  Flexeril at bedtime.  Prednisone taper.  Tramadol as needed for breakthrough pain.  Formal PT

## 2014-01-27 NOTE — Progress Notes (Signed)
   Subjective:    Patient ID: Becky Gibson, female    DOB: 10/25/1970, 43 y.o.   MRN: [id number]  HPI  Patient is a 43 year old female who presents to the clinic in 10 out of 10 pain in her low back with radiation into both legs. She states she has had this pain for the last 18 months but has not been this bad. She is coming to the office multiple times complaining of stomach/thyroid/overall pain and muscle spasms. We had sent her to a neurologist that did a full workup and determined she had fibromyalgia. She was on Lyrica for 2 months which helped with some of the muscle twitching but not with the pain. She denies any known injury or trauma to cause the pain. We did do a lumbar x-ray in the summer and did show some degenerative disc space narrowing in her lumbar spine. Pt declines any bowel or bladder dysfunction. Pt denies any saddle anthesia. Pain does wake patient up at night. Pt admits she sits at home all day and does not move.     Review of Systems  All other systems reviewed and are negative.      Objective:   Physical Exam  Constitutional: She appears well-developed and well-nourished.  Musculoskeletal:  Patient has limited range of motion in any form due to pain. It took 5 minutes and pt moaning and groaning to get on exam table.  Pt did have positive straight leg test bilaterally with numbness corresponding to L5 distrubution on bilateral heels.  Dr. Dianah Field assisted with examination due to intense pain.  Babinski negative.  DTR's were 1+symmetric.          Assessment & Plan:  Lumbar radiculopathy L5 distrubution/muscle spasms/DDD, lumbar- this certainly correlates to Lumbar disc space narrowing on xrays. It appears that there is likely some disc herniation at lumbar level.  Solumedrol 125mg  and toradaol 30mg  IM given today.  Prednisone taper.  Mobic daily.  Flexeril at bedtime. Sedation warning given.  Tramadol for break through pain.  Will order MRI due to extreme  pain.  Will start with formal PT.

## 2014-01-28 ENCOUNTER — Telehealth: Payer: Self-pay | Admitting: *Deleted

## 2014-01-28 NOTE — Telephone Encounter (Signed)
Policy does not require prior auth for MRI. Margette Fast, CMA

## 2014-02-05 ENCOUNTER — Other Ambulatory Visit: Payer: Self-pay | Admitting: Physician Assistant

## 2014-03-14 ENCOUNTER — Other Ambulatory Visit: Payer: Self-pay | Admitting: Physician Assistant

## 2014-03-31 ENCOUNTER — Ambulatory Visit (INDEPENDENT_AMBULATORY_CARE_PROVIDER_SITE_OTHER): Payer: BLUE CROSS/BLUE SHIELD | Admitting: Physician Assistant

## 2014-03-31 ENCOUNTER — Encounter: Payer: Self-pay | Admitting: Physician Assistant

## 2014-03-31 VITALS — BP 150/81 | HR 107 | Wt 197.0 lb

## 2014-03-31 DIAGNOSIS — R519 Headache, unspecified: Secondary | ICD-10-CM

## 2014-03-31 DIAGNOSIS — M5136 Other intervertebral disc degeneration, lumbar region: Secondary | ICD-10-CM

## 2014-03-31 DIAGNOSIS — R51 Headache: Secondary | ICD-10-CM

## 2014-03-31 DIAGNOSIS — M5416 Radiculopathy, lumbar region: Secondary | ICD-10-CM

## 2014-03-31 MED ORDER — AZITHROMYCIN 250 MG PO TABS: ORAL_TABLET | ORAL | Status: DC

## 2014-03-31 MED ORDER — SERTRALINE HCL 100 MG PO TABS: 100.0000 mg | ORAL_TABLET | Freq: Every day | ORAL | Status: DC

## 2014-03-31 MED ORDER — HYDROCODONE-ACETAMINOPHEN 5-325 MG PO TABS: 1.0000 | ORAL_TABLET | Freq: Three times a day (TID) | ORAL | Status: DC | PRN

## 2014-03-31 MED ORDER — MELOXICAM 15 MG PO TABS: 15.0000 mg | ORAL_TABLET | Freq: Every day | ORAL | Status: DC

## 2014-03-31 MED ORDER — PREDNISONE (PAK) 10 MG PO TABS: ORAL_TABLET | ORAL | Status: DC

## 2014-03-31 NOTE — Patient Instructions (Signed)
Will order MRI

## 2014-03-31 NOTE — Progress Notes (Signed)
   Subjective:    Patient ID: Becky Gibson, female    DOB: [birth year], 44 y.o.   MRN: [id number]  HPI Patient is a 44 year old female who presents to the clinic with lumbar radiculopathy in the L5 distribution. She continues to have intense pain. She did not get her MRI done due to finances. She is now ready to go through with MRI. After last visit with Solu-Medrol, Toradol, prednisone and low big patient felt amazing for the first 14 days. The pain is slowly come back to where she is nearly immobile. She is currently not on anything except tramadol as needed for pain. She does not like tramadol does not feel like it helps with any of her symptoms. She was not aware that she could continue the Mobic.   Patient has also had some drainage going down the back of her throat and sinus pressure for the last 2 weeks. She did have a cold 2-1/2 weeks ago that has resolved but the pressure and headache has lingered. Denies any fever, chills, shortness of breath, cough or wheezing.   Review of Systems  All other systems reviewed and are negative.      Objective:   Physical Exam  Constitutional: She appears well-developed and well-nourished.  HENT:  Head: Normocephalic and atraumatic.  Right Ear: External ear normal.  Left Ear: External ear normal.  Nose: Nose normal.  Bilateral TMs are erythematous with some slight bulging. No blood or pus seen.  Oropharynx erythematous with some postnasal drip present.  Tenderness to palpation over frontal and maxillary sinuses.  Eyes: Conjunctivae are normal. Right eye exhibits no discharge. Left eye exhibits no discharge.  Neck: Normal range of motion. Neck supple.  Lymphadenopathy:    She has no cervical adenopathy.  Psychiatric: She has a normal mood and affect. Her behavior is normal.          Assessment & Plan:  Lumbar radiculopathy-will reorder MRI to get done as soon as possible. This is the first thing that has to be done in order to get more  care. Patient declined any more injections. I did give her another prednisone taper pack. I did give her a few hydrocodone for her 10 out of 10 pain. Discuss to use cautiously. Did discuss abuse potential. Discussed she could use Mobitz daily. Refilled that for patient.  Facial pain/headache- i really think is sinusitis. treated with Z-Pak today. Symptomatic care given follow-up as needed.

## 2014-04-01 ENCOUNTER — Telehealth: Payer: Self-pay | Admitting: *Deleted

## 2014-04-01 NOTE — Telephone Encounter (Signed)
No prior auth required. I spoke with Bonnita Nasuti and this exam was not completed when first ordered in November due to patient not having a voicemail set up and she was unreachable to schedule exam.

## 2014-04-05 ENCOUNTER — Ambulatory Visit (INDEPENDENT_AMBULATORY_CARE_PROVIDER_SITE_OTHER): Payer: BLUE CROSS/BLUE SHIELD

## 2014-04-05 DIAGNOSIS — M5136 Other intervertebral disc degeneration, lumbar region: Secondary | ICD-10-CM

## 2014-04-05 DIAGNOSIS — M5416 Radiculopathy, lumbar region: Secondary | ICD-10-CM

## 2014-05-27 ENCOUNTER — Ambulatory Visit: Payer: BC Managed Care – PPO | Admitting: Neurology

## 2014-06-01 ENCOUNTER — Telehealth: Payer: Self-pay | Admitting: *Deleted

## 2014-06-01 NOTE — Telephone Encounter (Signed)
Becky Gibson called this afternoon wanting you to know that she saw the ortho surgeon's PA yesterday.  He basically told her that her options are epidurals or surgery.  She's having a hard time with this, emotionally & financially.  He also told her that he thinks she has some underlying arthritis and is wanting her to see Gavin Pound and then f/u with him in 1 month.  Also, she said that the lumps on her abdomen have spread and now they are very painful to the touch. There is a hard knot in the center under her breasts, she has terrible heartburn as well as nausea all the time.  Even though her ultrasound was fine from before, she would like to be sent to a [facility] for that.  She stated that multiple people have witnessed her stop breathing in her sleep and she is sure she has OSA.  I advised her that we can definitely set her up with a sleep study but that she will need to see you for documentation purposes.  She said that she will call & set up an appointment after the holiday. I will call this week to see if Dr. Jena Gauss office will fax over his notes from her visit yesterday.

## 2014-06-02 NOTE — Telephone Encounter (Signed)
We can certainly address some of these things in a an office visit. What is she taking for heartburn?

## 2014-06-21 ENCOUNTER — Telehealth: Payer: Self-pay | Admitting: Physician Assistant

## 2014-06-21 NOTE — Telephone Encounter (Signed)
Patient called very upset and in terrible pain.  She said that you told her to contact her directly if she needed to.  She feels like she cannot get any help and is having constant nerve pain.  She mentioned that she would be willing to be admitted to the hospital if it would help get her some answers/relief.  I told her that I would send you a phone note that she wants to speak to you.  Her contact number is [phone].  thanks

## 2014-06-21 NOTE — Telephone Encounter (Signed)
Call pt: I am so sorry she is in this pain. Unfortunately there is nothing besides pain medication that I can do. Have you tried epidural injections? Are you considering surgery? If you are in this much pain I may consider surgery. Have you called orthopedics office to see what they think?

## 2014-08-24 ENCOUNTER — Telehealth: Payer: Self-pay | Admitting: Neurology

## 2014-08-24 NOTE — Telephone Encounter (Signed)
I have seen the patient just once last year and neurological testing has been unremarkable. She can continue to see her primary physician or rheumatologist and get prescription for Lyrica. If she insists on being seen in our office nurse practitioner could see her

## 2014-08-24 NOTE — Telephone Encounter (Signed)
Spoke with patient who states she wants to go back on medication for her fibromyalgia. She states she thinks the dose of Lyrica she was on last year "needs to be upped". She states she is going to chiropractor for her bone/arthritis issues, which is helpful, but she continues to "ache all over from her fibromyalgia". She states that she would like generic for Lyrica if available, but will take Lyrica if it is only/best option.  Informed her Dr Leonie Man is in hospital, but this RN will route her request to him. Verified her pharmacy, [facility], [place], [place]. She verbalized understanding, appreciation.

## 2014-08-24 NOTE — Telephone Encounter (Signed)
Patient called inquiring if she could have RX for something that would be cheaper than Lyrica for fibromyalgia. She states she has not been taking any prescribed med for fibromyalgia. She had MRI which showed bones spurs, and degenerative disc disease,  arthritis and bulging disc.  She has been to chiropractor which has helped but she has 1 visit left out of 12 per insurance. Patient states she is in constant pain. Please call and advise. Patient can be reached at  [phone].

## 2014-08-25 NOTE — Telephone Encounter (Signed)
Spoke with patient and informed her of Dr Clydene Fake response. She stated that she would go through her PCP. She stated that her PCP "has been very helpful in the past". She verbalized understanding, appreciation.

## 2014-10-11 ENCOUNTER — Encounter: Payer: Self-pay | Admitting: Family Medicine

## 2014-10-11 ENCOUNTER — Ambulatory Visit (INDEPENDENT_AMBULATORY_CARE_PROVIDER_SITE_OTHER): Payer: BLUE CROSS/BLUE SHIELD | Admitting: Family Medicine

## 2014-10-11 VITALS — BP 148/86 | HR 76 | Ht 63.0 in | Wt 205.0 lb

## 2014-10-11 DIAGNOSIS — N760 Acute vaginitis: Secondary | ICD-10-CM

## 2014-10-11 DIAGNOSIS — M797 Fibromyalgia: Secondary | ICD-10-CM

## 2014-10-11 DIAGNOSIS — B9689 Other specified bacterial agents as the cause of diseases classified elsewhere: Secondary | ICD-10-CM

## 2014-10-11 DIAGNOSIS — R319 Hematuria, unspecified: Secondary | ICD-10-CM | POA: Diagnosis not present

## 2014-10-11 DIAGNOSIS — R5383 Other fatigue: Secondary | ICD-10-CM

## 2014-10-11 DIAGNOSIS — M722 Plantar fascial fibromatosis: Secondary | ICD-10-CM

## 2014-10-11 DIAGNOSIS — M545 Low back pain, unspecified: Secondary | ICD-10-CM

## 2014-10-11 DIAGNOSIS — R8290 Unspecified abnormal findings in urine: Secondary | ICD-10-CM | POA: Diagnosis not present

## 2014-10-11 DIAGNOSIS — A499 Bacterial infection, unspecified: Secondary | ICD-10-CM

## 2014-10-11 DIAGNOSIS — N3000 Acute cystitis without hematuria: Secondary | ICD-10-CM

## 2014-10-11 DIAGNOSIS — R252 Cramp and spasm: Secondary | ICD-10-CM

## 2014-10-11 LAB — POCT URINALYSIS DIPSTICK
BILIRUBIN UA: NEGATIVE
Glucose, UA: NEGATIVE
LEUKOCYTES UA: NEGATIVE
Nitrite, UA: NEGATIVE
PH UA: 5
Protein, UA: 30
Spec Grav, UA: >=1.03
UROBILINOGEN UA: 0.2

## 2014-10-11 LAB — WET PREP FOR TRICH, YEAST, CLUE
Trich, Wet Prep: NONE SEEN
WBC, Wet Prep HPF POC: NONE SEEN
Yeast Wet Prep HPF POC: NONE SEEN

## 2014-10-11 MED ORDER — PREGABALIN 75 MG PO CAPS: 75.0000 mg | ORAL_CAPSULE | Freq: Two times a day (BID) | ORAL | Status: DC

## 2014-10-11 MED ORDER — METRONIDAZOLE 500 MG PO TABS: 500.0000 mg | ORAL_TABLET | Freq: Two times a day (BID) | ORAL | Status: DC

## 2014-10-11 MED ORDER — SERTRALINE HCL 100 MG PO TABS: 100.0000 mg | ORAL_TABLET | Freq: Every day | ORAL | Status: DC

## 2014-10-11 NOTE — Progress Notes (Signed)
   Subjective:    Patient ID: Becky Gibson, female    DOB: [birth year], 45 y.o.   MRN: [id number]  HPI Urine odor x 1 year.  Over the last couple of weeks has had some urgency and ont feeling like completley emptying bladder. Getting some incontinence as well.  She started her menstrual cycle couple of days ago.  Dx with fibromyalgia last August.  Given 3 months for supply of Lyrica and did well on that. She does have bulging discs as well. She says just wants to sleep. Says doesn' teat much.  Getting sharp heel pain. She has been seeing chirpractor.  Having cramping in her first finger and thumb on the right hands.  No sleeping well. She's having a lot of muscle pain and tenderness as well. Painful for people to touch her. She's also getting occasional burning sensation in her lumbar spine where she knows she has degenerative disc disease.   She also complains of heel spurs. She was also working with a Restaurant manager, fast food on this. She says it's very painful when she first puts pressure on her foot in the mornings. She has not tried any stretches or physical therapy. She has been taking a lot of anti-inflammatory's.  She also complains of cramping in her hands particularly in the thumb and first finger. As well as in her feet. The she says it can be random in other places in her body. She's not had any blood work in the last year. She denies any major changes to her diet.  Review of Systems     Objective:   Physical Exam  Constitutional: She is oriented to person, place, and time. She appears well-developed and well-nourished.  HENT:  Head: Normocephalic and atraumatic.  Eyes: Conjunctivae and EOM are normal.  Cardiovascular: Normal rate, regular rhythm and normal heart sounds.   Pulmonary/Chest: Effort normal and breath sounds normal.  Musculoskeletal:  Mildly tender over the lumbar spine and paraspinous muscles.  Neurological: She is alert and oriented to person, place, and time.  Skin: Skin is  warm and dry. No pallor.  Psychiatric: She has a normal mood and affect. Her behavior is normal.          Assessment & Plan:  Fibromyalgia- will restart Lyrica.  Discussed potential side effects of the medication. We'll start with 75 mg twice a day which the typical starting dose for fiber myalgia and have her follow-up in one month with her primary care provider, Jade.  Chronic back pain with disc bulging - has been working with a Restaurant manager, fast food for quite some time. She has never completed physical therapy. She didn't think she would be able to. I discussed physical therapy can be extremely helpful especially initially with heat massage etc. and eventually working on core strengthening. I encouraged her to strongly reconsider physical therapy as an alternative treatment.  Odor to urine-we'll send for urine culture since was a little bit of blood in the urinalysis but she is also demonstrating right now.  Plantar fasciitis with or without a heel spur-recommend starting with home stretches over the next 3-4 weeks as well as icing. Actually encouraged her to decrease her antibiotic for intake as I think this may be causing some other symptoms included her elevated blood pressure today. Recheck blood pressure fall up as well. If the exercises are not improving her pain then follow-up with PCP.  Cramping-evaluate for abnormal magnesium potassium, and calcium levels.

## 2014-10-11 NOTE — Addendum Note (Signed)
Addended by: Beatrice Lecher D on: 10/11/2014 09:26 PM   Modules accepted: Orders

## 2014-10-12 LAB — URINALYSIS, MICROSCOPIC ONLY
CASTS: NONE SEEN [LPF]
Crystals: NONE SEEN [HPF]
YEAST: NONE SEEN [HPF]

## 2014-10-13 LAB — URINE CULTURE: Colony Count: >=100000

## 2014-10-14 MED ORDER — SULFAMETHOXAZOLE-TRIMETHOPRIM 800-160 MG PO TABS: 1.0000 | ORAL_TABLET | Freq: Two times a day (BID) | ORAL | Status: DC

## 2014-10-14 NOTE — Addendum Note (Signed)
Addended by: Beatrice Lecher D on: 10/14/2014 11:21 AM   Modules accepted: Orders

## 2014-11-10 ENCOUNTER — Ambulatory Visit (INDEPENDENT_AMBULATORY_CARE_PROVIDER_SITE_OTHER): Payer: BLUE CROSS/BLUE SHIELD | Admitting: Physician Assistant

## 2014-11-10 ENCOUNTER — Encounter: Payer: Self-pay | Admitting: Physician Assistant

## 2014-11-10 VITALS — BP 143/86 | HR 95 | Ht 63.0 in | Wt 203.0 lb

## 2014-11-10 DIAGNOSIS — S139XXA Sprain of joints and ligaments of unspecified parts of neck, initial encounter: Secondary | ICD-10-CM | POA: Diagnosis not present

## 2014-11-10 DIAGNOSIS — T7491XA Unspecified adult maltreatment, confirmed, initial encounter: Secondary | ICD-10-CM | POA: Diagnosis not present

## 2014-11-10 DIAGNOSIS — M542 Cervicalgia: Secondary | ICD-10-CM | POA: Insufficient documentation

## 2014-11-10 MED ORDER — KETOROLAC TROMETHAMINE 30 MG/ML IJ SOLN
30.0000 mg | Freq: Once | INTRAMUSCULAR | Status: AC
  Administered 2014-11-10: 30 mg via INTRAMUSCULAR

## 2014-11-10 MED ORDER — MELOXICAM 15 MG PO TABS: 15.0000 mg | ORAL_TABLET | Freq: Every day | ORAL | Status: DC

## 2014-11-10 MED ORDER — HYDROCODONE-ACETAMINOPHEN 5-325 MG PO TABS: 1.0000 | ORAL_TABLET | Freq: Three times a day (TID) | ORAL | Status: DC | PRN

## 2014-11-10 MED ORDER — CYCLOBENZAPRINE HCL 10 MG PO TABS: 10.0000 mg | ORAL_TABLET | Freq: Three times a day (TID) | ORAL | Status: DC | PRN
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
## 2015-01-18 ENCOUNTER — Other Ambulatory Visit: Payer: Self-pay | Admitting: Physician Assistant

## 2015-01-18 MED ORDER — SERTRALINE HCL 100 MG PO TABS: 100.0000 mg | ORAL_TABLET | Freq: Every day | ORAL | Status: DC

## 2015-01-18 MED ORDER — TRAMADOL HCL 50 MG PO TABS: 50.0000 mg | ORAL_TABLET | Freq: Three times a day (TID) | ORAL | Status: DC | PRN

## 2015-01-18 MED ORDER — ORPHENADRINE CITRATE ER 100 MG PO TB12: 100.0000 mg | ORAL_TABLET | Freq: Two times a day (BID) | ORAL | Status: DC

## 2015-01-18 NOTE — Telephone Encounter (Signed)
Pt called to request a 30-day supply on her Zoloft be sent to [facility] in [place]. Pt reports her father was diagnosed with Lukemia and he is not doing well. Pt states she has been staying there with him and is unable to get back into town for next refill. Will route to PCP for approval.  Pt also states the Rx for muscle spasms (flexeril) makes Pt very drowsy and wants to know if something else can be sent in. Pt requested a refill on hydrocodone, I advised we could not e-scribe this Rx nor fax it. Questioned if Pt was still taking the Meloxicam, Pt reports she was on that "in the past" and "it didn't help." Will route to PCP to see if there is an alternative.   I have sent the pharmacy in chart to what is closest to Pt currently in [place].

## 2015-01-18 NOTE — Telephone Encounter (Signed)
Spoke with Pt, she is willing to try the Norflex and Tramadol. Pharmacy has been updated in system to the closest to her currently in [place].

## 2015-01-18 NOTE — Telephone Encounter (Signed)
Tramadol Rx faxed.

## 2015-01-18 NOTE — Telephone Encounter (Signed)
Ok to send zoloft.  Can try norflex for muscle relaxer 100mg  bid #30.  For pain we can't send anything other than tramadol to pharmacy that far away.

## 2015-02-11 IMAGING — US US SOFT TISSUE HEAD/NECK
1 series · 14 of 25 positions shown · non-contrast
Comparison: None.

CLINICAL DATA: Left goiter on physical exam.

EXAM:
THYROID ULTRASOUND
TECHNIQUE: Ultrasound examination of the thyroid gland and adjacent soft
tissues was performed.

[Series 1: us soft tissue head/neck · 0.09mm/px · 14 of 71 slices shown]
[im 1/71]
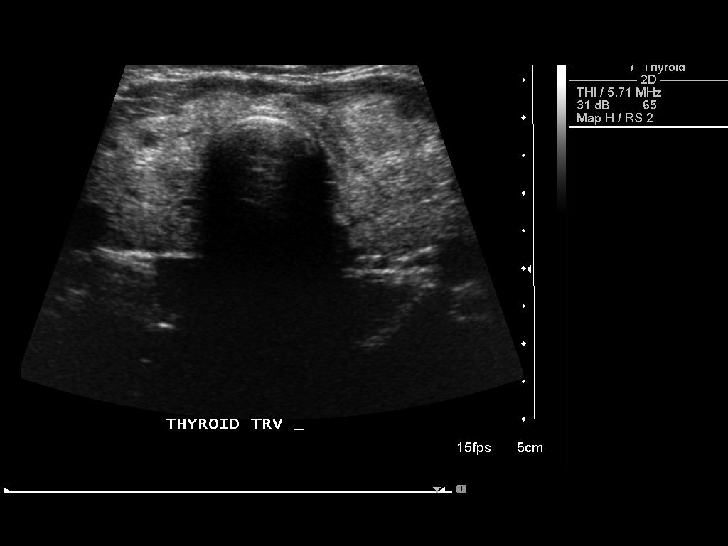
[im 6/71]
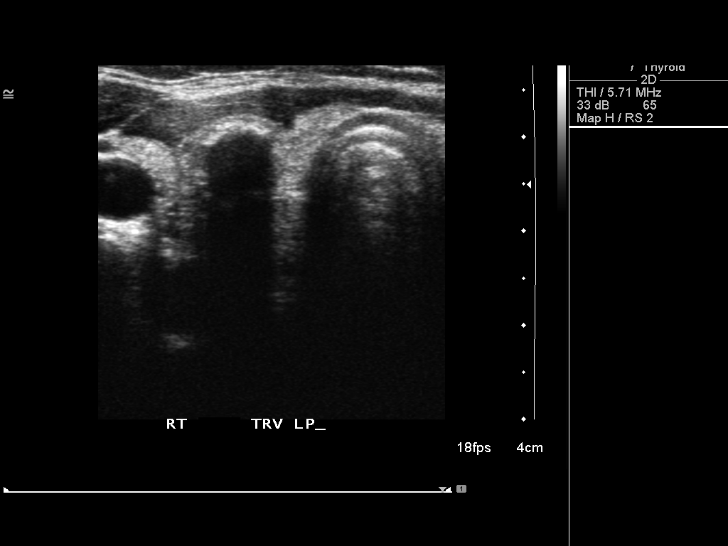
[im 12/71]
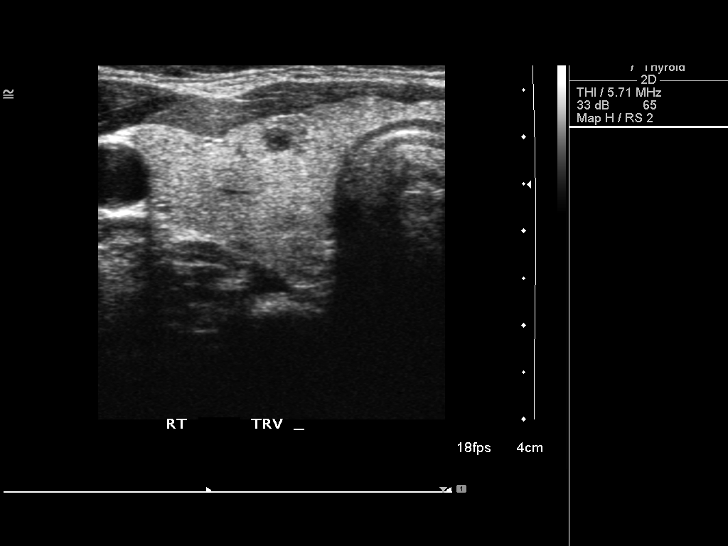
[im 18/71]
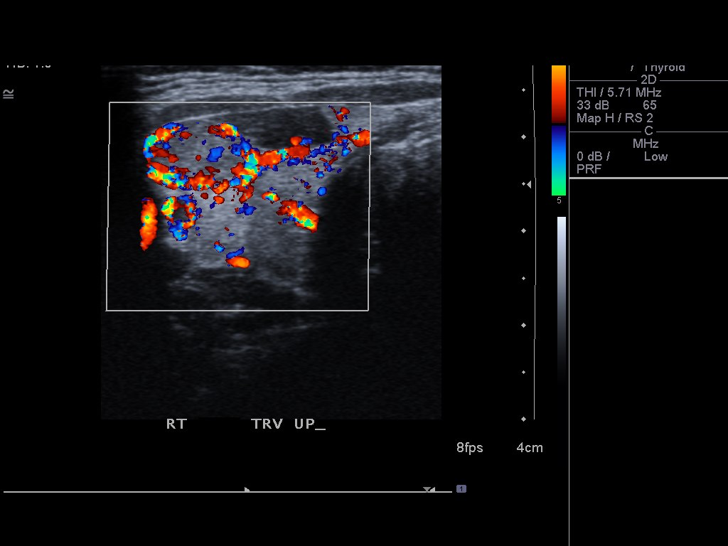
[im 24/71]
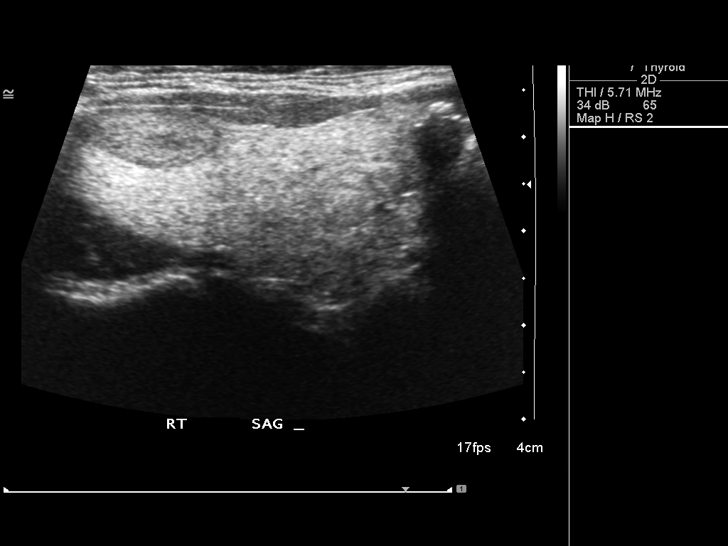
[im 27/71]
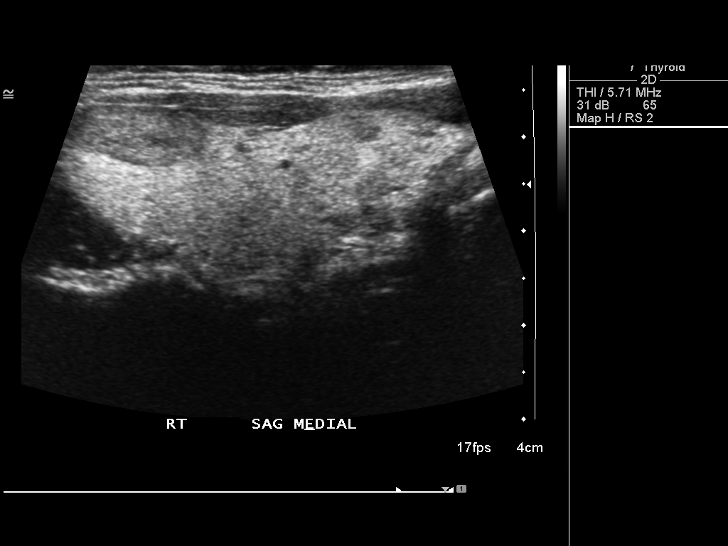
[im 33/71]
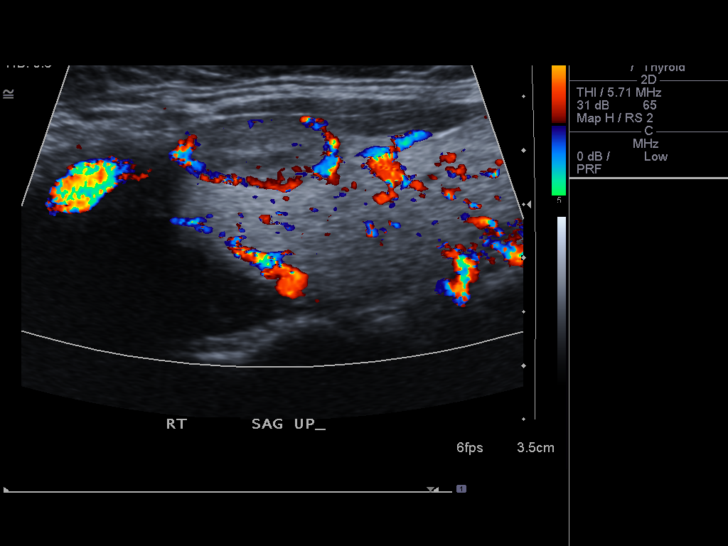
[im 38/71]
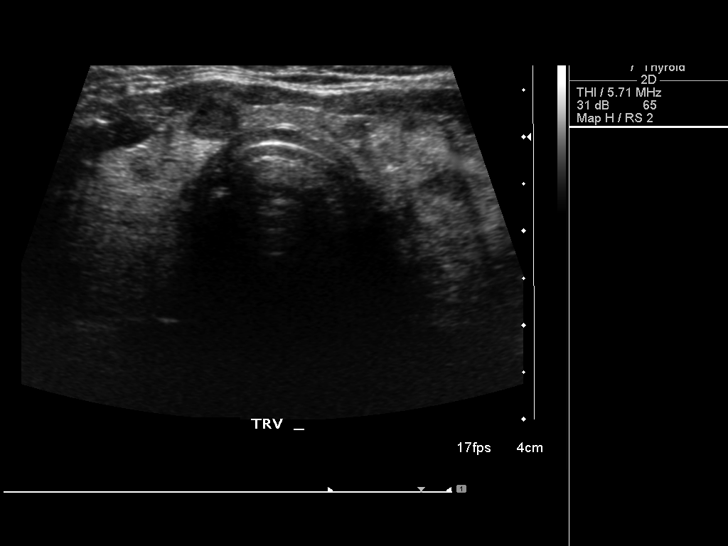
[im 44/71]
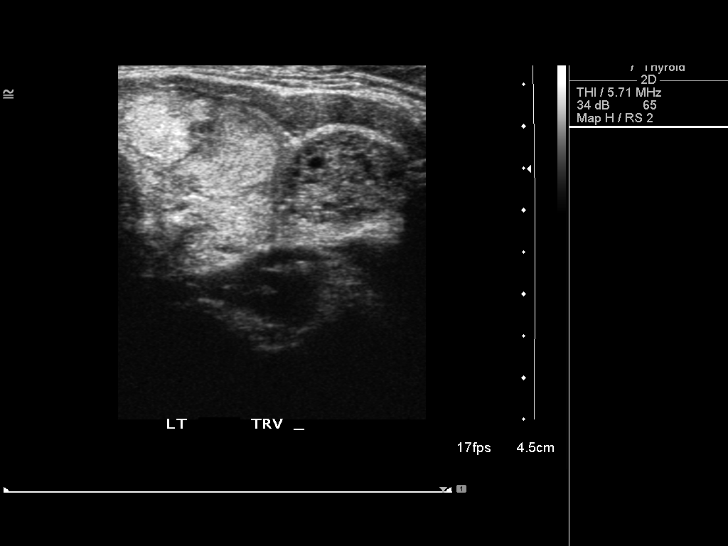
[im 47/71]
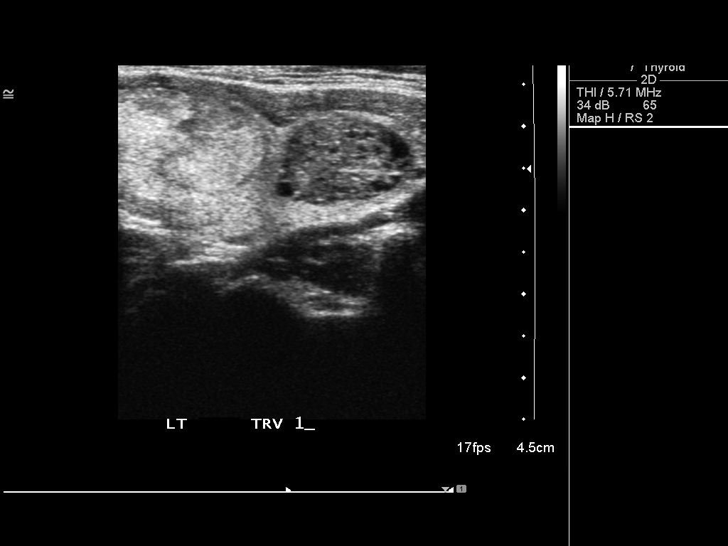
[im 53/71]
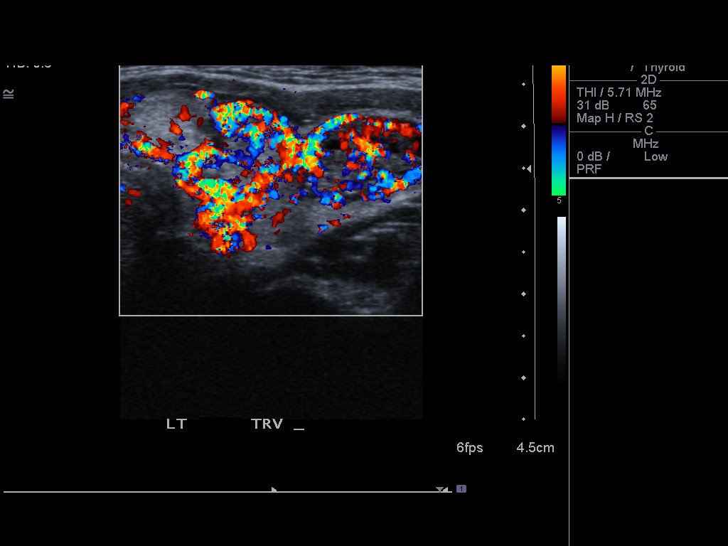
[im 59/71]
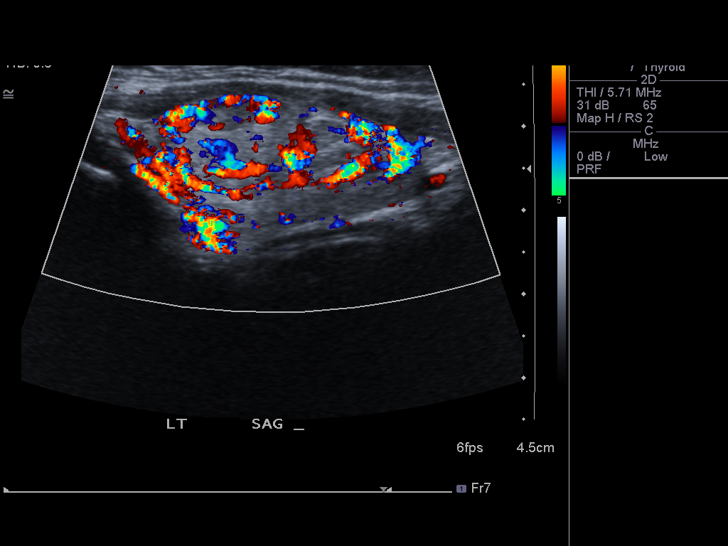
[im 65/71]
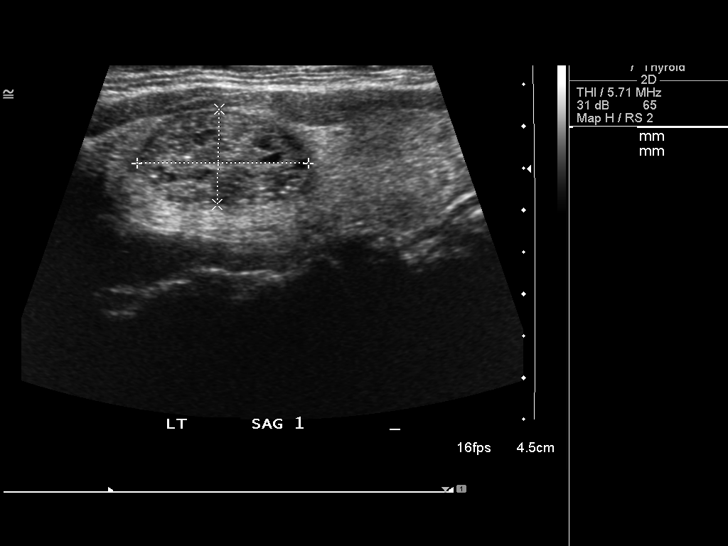
[im 71/71]
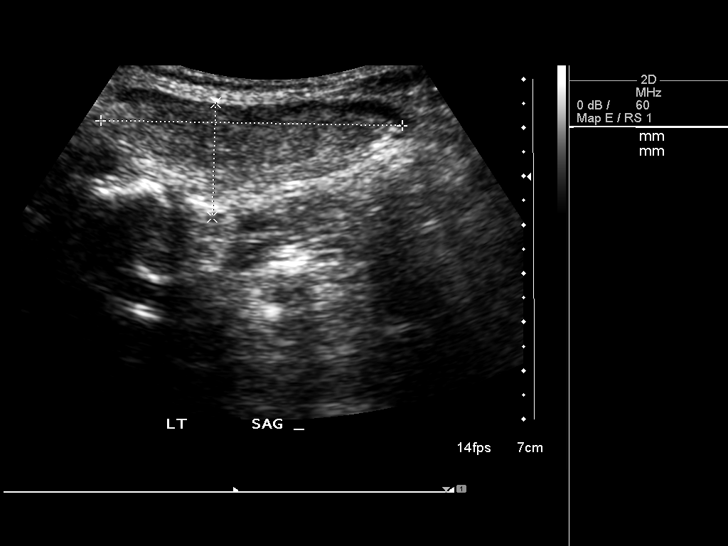

[14 of 25 positions shown; findings below may reference images not displayed]

FINDINGS: Right thyroid lobe

Measurements: 54 x 20 x 23 mm. Mildly inhomogeneous background
parenchyma.

12 x 9 x 12 mm peripherally calcified hypoechoic/cystic lesion,
lower pole

15 x 7 x 20 mm hypoechoic nodule, upper pole

Left thyroid lobe

Measurements: 62 x 24 x 37 mm.  Homogeneous background parenchyma.

21 x 11 x 15 mm nodule, mid lobe

31 x 11 x 25 mm solid lobular nodule, upper pole

Isthmus

Thickness: 3 mm.  No nodules visualized.

Lymphadenopathy

None visualized.
IMPRESSION: Thyromegaly with bilateral nodules. Findings meet consensus criteria
for biopsy. Ultrasound-guided fine needle aspiration should be
considered, as per the consensus statement: Management of Thyroid
Nodules Detected at US: Society of Radiologists in Ultrasound

## 2015-03-01 ENCOUNTER — Other Ambulatory Visit: Payer: Self-pay | Admitting: *Deleted

## 2015-03-01 MED ORDER — SERTRALINE HCL 100 MG PO TABS: 100.0000 mg | ORAL_TABLET | Freq: Every day | ORAL | Status: DC

## 2015-03-29 DIAGNOSIS — Z0271 Encounter for disability determination: Secondary | ICD-10-CM

## 2015-04-05 ENCOUNTER — Ambulatory Visit (INDEPENDENT_AMBULATORY_CARE_PROVIDER_SITE_OTHER): Payer: BLUE CROSS/BLUE SHIELD

## 2015-04-05 ENCOUNTER — Encounter: Payer: Self-pay | Admitting: Physician Assistant

## 2015-04-05 ENCOUNTER — Ambulatory Visit (INDEPENDENT_AMBULATORY_CARE_PROVIDER_SITE_OTHER): Payer: BLUE CROSS/BLUE SHIELD | Admitting: Physician Assistant

## 2015-04-05 VITALS — BP 140/102 | HR 104 | Ht 63.0 in | Wt 207.0 lb

## 2015-04-05 DIAGNOSIS — R5383 Other fatigue: Secondary | ICD-10-CM

## 2015-04-05 DIAGNOSIS — M5136 Other intervertebral disc degeneration, lumbar region: Secondary | ICD-10-CM | POA: Diagnosis not present

## 2015-04-05 DIAGNOSIS — R0683 Snoring: Secondary | ICD-10-CM

## 2015-04-05 DIAGNOSIS — R200 Anesthesia of skin: Secondary | ICD-10-CM

## 2015-04-05 DIAGNOSIS — R32 Unspecified urinary incontinence: Secondary | ICD-10-CM | POA: Insufficient documentation

## 2015-04-05 DIAGNOSIS — M546 Pain in thoracic spine: Secondary | ICD-10-CM | POA: Diagnosis not present

## 2015-04-05 DIAGNOSIS — M542 Cervicalgia: Secondary | ICD-10-CM

## 2015-04-05 DIAGNOSIS — M2550 Pain in unspecified joint: Secondary | ICD-10-CM

## 2015-04-05 DIAGNOSIS — M5416 Radiculopathy, lumbar region: Secondary | ICD-10-CM | POA: Diagnosis not present

## 2015-04-05 DIAGNOSIS — E669 Obesity, unspecified: Secondary | ICD-10-CM

## 2015-04-05 DIAGNOSIS — E6609 Other obesity due to excess calories: Secondary | ICD-10-CM | POA: Insufficient documentation

## 2015-04-05 LAB — WET PREP FOR TRICH, YEAST, CLUE
CLUE CELLS WET PREP: NONE SEEN
Trich, Wet Prep: NONE SEEN
Yeast Wet Prep HPF POC: NONE SEEN

## 2015-04-05 MED ORDER — GABAPENTIN 300 MG PO CAPS: 300.0000 mg | ORAL_CAPSULE | Freq: Three times a day (TID) | ORAL | Status: DC

## 2015-04-05 MED ORDER — PREDNISONE 10 MG (21) PO TBPK: ORAL_TABLET | ORAL | Status: DC

## 2015-04-05 MED ORDER — HYDROCODONE-ACETAMINOPHEN 5-325 MG PO TABS: 1.0000 | ORAL_TABLET | Freq: Three times a day (TID) | ORAL | Status: DC | PRN

## 2015-04-05 MED ORDER — DULOXETINE HCL 30 MG PO CPEP: 30.0000 mg | ORAL_CAPSULE | Freq: Every day | ORAL | Status: DC

## 2015-04-05 NOTE — Patient Instructions (Signed)
zoloft 1/2 tablet for 7 days and cymbalta 1 full tablet. Then stop zoloft.

## 2015-04-05 NOTE — Progress Notes (Addendum)
   Subjective:    Patient ID: Becky Gibson, female    DOB: [birth year], 45 y.o.   MRN: [id number]  HPI  Patient is a 45 yo female with a history of lumbar degenerative disc disease and lumbar radiculopathy. I sent her originally to a Psychologist, sport and exercise and she refused surgery. For the last 2 years she has dealt with these symptoms. She has been diagnosed with fibromyalgia but she does not believe she has this. Currently today she is only on Zoloft. She feels like muscle relaxers and ibuprofen do nothing for her pain. She was on Lyrica for a while and it did seem to help minimally but it was too expensive. She has tried Therapist, nutritional with NO benefit. Over past month she has had 2 episodes of urinary incontinence where she did not even know she had wet herself. She just "hurts all over" she cannot sit for long periods of time or stand for long periods of time without being in significant pain is at home her pain ranges from a 7 out of 10 to a 10 out of 10. She has not been able to go grocery shopping or wash dishes or do laundry. She cannot pick up anything off the floor. She is ready for change. She wants to be checked for RA. She is also having more neck pain as well. Bilateral arm and hand numbness at times. Her back just feels like it is on fire. She has no energy. She admits to depression but due to pain. She is applying for diabetes.   Hx of BV. Her vaginal discharge has increased over past month. Would like checked.     Review of Systems  All other systems reviewed and are negative.      Objective:   Physical Exam  Constitutional: She is oriented to person, place, and time. She appears well-developed and well-nourished.  Obese.   HENT:  Head: Normocephalic and atraumatic.  Cardiovascular: Normal rate, regular rhythm and normal heart sounds.   Pulmonary/Chest: Effort normal and breath sounds normal. She has no wheezes.  Musculoskeletal:  No ROM at waist due to pain.  ROM at neck limited due to pain.   Bilateral positive straight leg test.  Tenderness to palpation over cervical and lumbar spine.  No reflexes at patella.  Tenderness over entire back to palpation with tightness.   Hand grip decreased with wrist popping.  Upper extermity decreased 3/5.   Neurological: She is alert and oriented to person, place, and time.  Psychiatric: She has a normal mood and affect. Her behavior is normal.          Assessment & Plan:  Lumbar radiculopathy/lumbar degenerative disc disease/urinary incontinence-prednisone given today. norco as needed. Gabapentin started. Will switch zoloft to cymbalta. i am concerned with red flag of urinary incontinence. She needs to be evaluated for surgery. Will send to [facility].   Neck pain/cervical radiculopaty-will get x-ray today. Gabapentin started. Offered muscle relaxer pt declined.   Depression- pt is certainly depressed due to pain. Tapering off zoloft and adding cymbalta.   Fibromyalgia- pt is adamant that she does not have fibromyalgia but she does have many features. cymbalta and gabapentin should help.    Vaginal discharge- wet prep ordered. Hx of BV. Will call with results.   No energy/multiple joint pain- RA testing done. Vitamin D, TSH, b12, cbc, ferritin.    Spent 30 minutes with patient and greater than 50 percent of visit spent counseling patient regarding next steps.

## 2015-04-06 ENCOUNTER — Encounter: Payer: Self-pay | Admitting: Physician Assistant

## 2015-04-06 DIAGNOSIS — R790 Abnormal level of blood mineral: Secondary | ICD-10-CM | POA: Insufficient documentation

## 2015-04-06 DIAGNOSIS — E559 Vitamin D deficiency, unspecified: Secondary | ICD-10-CM | POA: Insufficient documentation

## 2015-04-06 LAB — CBC
HCT: 43.6 % (ref 36.0–46.0)
HEMOGLOBIN: 14.8 g/dL (ref 12.0–15.0)
MCH: 28.4 pg (ref 26.0–34.0)
MCHC: 33.9 g/dL (ref 30.0–36.0)
MCV: 83.5 fL (ref 78.0–100.0)
MPV: 8.2 fL — AB (ref 8.6–12.4)
PLATELETS: 291 10*3/uL (ref 150–400)
RBC: 5.22 MIL/uL — AB (ref 3.87–5.11)
RDW: 14.2 % (ref 11.5–15.5)
WBC: 9.5 10*3/uL (ref 4.0–10.5)

## 2015-04-06 LAB — COMPREHENSIVE METABOLIC PANEL
ALBUMIN: 4.4 g/dL (ref 3.6–5.1)
ALT: 9 U/L (ref 6–29)
AST: 7 U/L — AB (ref 10–35)
Alkaline Phosphatase: 92 U/L (ref 33–115)
BILIRUBIN TOTAL: 0.4 mg/dL (ref 0.2–1.2)
BUN: 14 mg/dL (ref 7–25)
CHLORIDE: 103 mmol/L (ref 98–110)
CO2: 24 mmol/L (ref 20–31)
CREATININE: 0.64 mg/dL (ref 0.50–1.10)
Calcium: 9.3 mg/dL (ref 8.6–10.2)
Glucose, Bld: 90 mg/dL (ref 65–99)
Potassium: 4 mmol/L (ref 3.5–5.3)
SODIUM: 139 mmol/L (ref 135–146)
TOTAL PROTEIN: 6.6 g/dL (ref 6.1–8.1)

## 2015-04-06 LAB — C-REACTIVE PROTEIN

## 2015-04-06 LAB — FERRITIN: FERRITIN: 17 ng/mL (ref 10–291)

## 2015-04-06 LAB — TSH: TSH: 0.909 u[IU]/mL (ref 0.350–4.500)

## 2015-04-06 LAB — RHEUMATOID FACTOR

## 2015-04-06 LAB — VITAMIN B12: VITAMIN B 12: 298 pg/mL (ref 211–911)

## 2015-04-06 LAB — VITAMIN D 25 HYDROXY (VIT D DEFICIENCY, FRACTURES): VIT D 25 HYDROXY: 9 ng/mL — AB (ref 30–100)

## 2015-04-06 LAB — SEDIMENTATION RATE: Sed Rate: 9 mm/hr (ref 0–20)

## 2015-04-06 LAB — CYCLIC CITRUL PEPTIDE ANTIBODY, IGG

## 2015-04-06 MED ORDER — VITAMIN D (ERGOCALCIFEROL) 1.25 MG (50000 UNIT) PO CAPS: 50000.0000 [IU] | ORAL_CAPSULE | ORAL | Status: DC

## 2015-04-07 LAB — URINE CULTURE: Colony Count: 25000

## 2015-04-11 ENCOUNTER — Other Ambulatory Visit: Payer: BLUE CROSS/BLUE SHIELD

## 2015-04-18 ENCOUNTER — Ambulatory Visit (INDEPENDENT_AMBULATORY_CARE_PROVIDER_SITE_OTHER): Payer: BLUE CROSS/BLUE SHIELD

## 2015-04-18 DIAGNOSIS — M542 Cervicalgia: Secondary | ICD-10-CM | POA: Diagnosis not present

## 2015-04-19 ENCOUNTER — Encounter: Payer: Self-pay | Admitting: Physician Assistant

## 2015-04-19 DIAGNOSIS — M4722 Other spondylosis with radiculopathy, cervical region: Secondary | ICD-10-CM | POA: Insufficient documentation

## 2015-04-19 DIAGNOSIS — M5020 Other cervical disc displacement, unspecified cervical region: Secondary | ICD-10-CM | POA: Insufficient documentation

## 2015-06-01 ENCOUNTER — Encounter (HOSPITAL_BASED_OUTPATIENT_CLINIC_OR_DEPARTMENT_OTHER): Payer: BLUE CROSS/BLUE SHIELD

## 2015-06-16 ENCOUNTER — Ambulatory Visit (HOSPITAL_BASED_OUTPATIENT_CLINIC_OR_DEPARTMENT_OTHER): Payer: BLUE CROSS/BLUE SHIELD | Attending: Physician Assistant

## 2015-06-16 VITALS — Ht 63.0 in | Wt 200.0 lb

## 2015-06-16 DIAGNOSIS — F411 Generalized anxiety disorder: Secondary | ICD-10-CM | POA: Diagnosis not present

## 2015-06-16 DIAGNOSIS — Z6835 Body mass index (BMI) 35.0-35.9, adult: Secondary | ICD-10-CM | POA: Diagnosis not present

## 2015-06-16 DIAGNOSIS — E669 Obesity, unspecified: Secondary | ICD-10-CM | POA: Diagnosis not present

## 2015-06-16 DIAGNOSIS — R5383 Other fatigue: Secondary | ICD-10-CM | POA: Insufficient documentation

## 2015-06-16 DIAGNOSIS — R0683 Snoring: Secondary | ICD-10-CM | POA: Diagnosis not present

## 2015-06-16 DIAGNOSIS — G4733 Obstructive sleep apnea (adult) (pediatric): Secondary | ICD-10-CM | POA: Diagnosis not present

## 2015-06-16 DIAGNOSIS — G4700 Insomnia, unspecified: Secondary | ICD-10-CM | POA: Insufficient documentation

## 2015-06-19 DIAGNOSIS — G4733 Obstructive sleep apnea (adult) (pediatric): Secondary | ICD-10-CM | POA: Diagnosis not present

## 2015-06-19 NOTE — Progress Notes (Signed)
   Patient Name: Becky Gibson, Becky Gibson Date: 06/16/2015 Gender: Female D.O.B: 1971/02/12 Age (years): 44 Referring Provider: Iran Planas Height (inches): 63 Interpreting Physician: Baird Lyons MD, ABSM Weight (lbs): 200 RPSGT: Baxter Flattery BMI: 35 MRN: [id number] Neck Size: 15.00 CLINICAL INFORMATION Sleep Study Type: NPSG Indication for sleep study: Fatigue, Obesity, Snoring Epworth Sleepiness Score: 9  SLEEP STUDY TECHNIQUE As per the AASM Manual for the Scoring of Sleep and Associated Events v2.3 (April 2016) with a hypopnea requiring 4% desaturations. The channels recorded and monitored were frontal, central and occipital EEG, electrooculogram (EOG), submentalis EMG (chin), nasal and oral airflow, thoracic and abdominal wall motion, anterior tibialis EMG, snore microphone, electrocardiogram, and pulse oximetry.  MEDICATIONS Patient's medications include: charted for review Medications self-administered by patient during sleep study : No sleep medicine administered.  SLEEP ARCHITECTURE The study was initiated at 10:46:34 PM and ended at 5:11:24 AM. Sleep onset time was 71.0 minutes and the sleep efficiency was 53.6%. The total sleep time was 206.3 minutes. Stage REM latency was N/A minutes. The patient spent 15.27% of the night in stage N1 sleep, 84.49% in stage N2 sleep, 0.24% in stage N3 and 0.00% in REM. Alpha intrusion was absent. Supine sleep was 0.00%. Wake after sleep onset 107 minutes  RESPIRATORY PARAMETERS The overall apnea/hypopnea index (AHI) was 32.6 per hour. There were 38 total apneas, including 37 obstructive, 1 central and 0 mixed apneas. There were 74 hypopneas and 0 RERAs. The AHI during Stage REM sleep was N/A per hour. AHI while supine was N/A per hour. The mean oxygen saturation was 93.68%. The minimum SpO2 during sleep was 82.00%. Loud snoring was noted during this study.  CARDIAC DATA The 2 lead EKG demonstrated sinus rhythm. The mean heart  rate was 81.62 beats per minute. Other EKG findings include: None . LEG MOVEMENT DATA The total PLMS were 5 with a resulting PLMS index of 1.45. Associated arousal with leg movement index was 0.0 .  IMPRESSIONS - Moderate obstructive sleep apnea occurred during this study (AHI = 32.6/h). - No significant central sleep apnea occurred during this study (CAI = 0.3/h). - Mild oxygen desaturation was noted during this study (Min O2 = 82.00%). - Significant difficulty initiating and maintaining sleep, with insufficient early sleep to meet protocol requirements for split  CPAP titration - The patient snored with Loud snoring volume. - No cardiac abnormalities were noted during this study. - Clinically significant periodic limb movements did not occur during sleep. No significant associated arousals.  DIAGNOSIS - Obstructive Sleep Apnea (327.23 [G47.33 ICD-10]) - Difficulty initiating and maintaining sleep- insomnia  RECOMMENDATIONS - Therapeutic CPAP titration to determine optimal pressure required to alleviate sleep disordered breathing. - Avoid alcohol, sedatives and other CNS depressants that may worsen sleep apnea and disrupt normal sleep architecture. - Sleep hygiene should be reviewed to assess factors that may improve sleep quality. - Weight management and regular exercise should be initiated or continued if appropriate.  Deneise Lever Diplomate, American Board of Sleep Medicine  ELECTRONICALLY SIGNED ON:  06/19/2015, 2:46 PM [place] PH: [phone]   FX: [phone] [place]

## 2015-06-20 ENCOUNTER — Encounter: Payer: Self-pay | Admitting: Physician Assistant

## 2015-06-20 ENCOUNTER — Telehealth: Payer: Self-pay | Admitting: Physician Assistant

## 2015-06-20 DIAGNOSIS — G4700 Insomnia, unspecified: Secondary | ICD-10-CM

## 2015-06-20 DIAGNOSIS — G4733 Obstructive sleep apnea (adult) (pediatric): Secondary | ICD-10-CM

## 2015-06-20 NOTE — Telephone Encounter (Signed)
Call pt:  OSA positive on sleep study. Need CPAP machine. Insomnia was also present. Please send order for CPAP machine to company. See order in EMR.  Does patient want a sleep aid to help with sleep?

## 2015-07-01 ENCOUNTER — Telehealth: Payer: Self-pay | Admitting: *Deleted

## 2015-07-01 MED ORDER — AMBULATORY NON FORMULARY MEDICATION: Status: DC

## 2015-07-01 NOTE — Telephone Encounter (Signed)
CPAP titration sleep study ordered.

## 2015-07-06 ENCOUNTER — Other Ambulatory Visit: Payer: Self-pay | Admitting: Physician Assistant

## 2015-07-07 NOTE — Telephone Encounter (Signed)
CPAP titration study ordered

## 2015-09-07 ENCOUNTER — Ambulatory Visit (HOSPITAL_BASED_OUTPATIENT_CLINIC_OR_DEPARTMENT_OTHER): Payer: BLUE CROSS/BLUE SHIELD | Attending: Physician Assistant | Admitting: Internal Medicine

## 2015-09-07 VITALS — Ht 63.0 in | Wt 200.0 lb

## 2015-09-07 DIAGNOSIS — G4700 Insomnia, unspecified: Secondary | ICD-10-CM

## 2015-09-07 DIAGNOSIS — G4733 Obstructive sleep apnea (adult) (pediatric): Secondary | ICD-10-CM | POA: Diagnosis not present

## 2015-09-07 DIAGNOSIS — R0683 Snoring: Secondary | ICD-10-CM | POA: Diagnosis not present

## 2015-09-11 NOTE — Procedures (Signed)
   Patient Name: Becky Gibson, [name] Date: 09/07/2015 Gender: Female D.O.B: 16-Dec-1970 Age (years): 44 Referring Provider: Iran Planas Height (inches): 63 Interpreting Physician: Baird Lyons MD, ABSM Weight (lbs): 200 RPSGT: Laren Everts BMI: 35 MRN: [id number] Neck Size: 15.00 CLINICAL INFORMATION The patient is referred for a CPAP titration to treat sleep apnea.   Date of NPSG, Split Night or HST:  Diagnostic NPSG 06/16/15  AHI 32.6/ hr, desaturation to 82%, body weight 200 lbs  SLEEP STUDY TECHNIQUE As per the AASM Manual for the Scoring of Sleep and Associated Events v2.3 (April 2016) with a hypopnea requiring 4% desaturations. The channels recorded and monitored were frontal, central and occipital EEG, electrooculogram (EOG), submentalis EMG (chin), nasal and oral airflow, thoracic and abdominal wall motion, anterior tibialis EMG, snore microphone, electrocardiogram, and pulse oximetry. Continuous positive airway pressure (CPAP) was initiated at the beginning of the study and titrated to treat sleep-disordered breathing.  MEDICATIONS Medications taken by the patient : charted for review Medications administered by patient during sleep study : No sleep medicine administered.  TECHNICIAN COMMENTS Comments added by technician: Patient had difficulty initiating sleep.  Comments added by scorer: N/A  RESPIRATORY PARAMETERS Optimal PAP Pressure (cm): 13 AHI at Optimal Pressure (/hr): 1.2 Overall Minimal O2 (%): 91.00 Supine % at Optimal Pressure (%): 0 Minimal O2 at Optimal Pressure (%): 93.0    SLEEP ARCHITECTURE The study was initiated at 10:49:32 PM and ended at 4:54:58 AM. Sleep onset time was 42.6 minutes and the sleep efficiency was 75.4%. The total sleep time was 275.5 minutes. The patient spent 3.45% of the night in stage N1 sleep, 51.18% in stage N2 sleep, 3.63% in stage N3 and 41.74% in REM.Stage REM latency was 71.5 minutes Wake after sleep onset was 47.3.  Alpha intrusion was absent. Supine sleep was 0.00%.  CARDIAC DATA The 2 lead EKG demonstrated sinus rhythm. The mean heart rate was 86.43 beats per minute. Other EKG findings include: None.  LEG MOVEMENT DATA The total Periodic Limb Movements of Sleep (PLMS) were 0. The PLMS index was 0.00. A PLMS index of <15 is considered normal in adults.  IMPRESSIONS - The optimal PAP pressure was 13 cm of water. - Central sleep apnea was not noted during this titration (CAI = 1.3/h). - Significant oxygen desaturations were not observed during this titration (min O2 = 91.00%). - The patient snored with Loud snoring volume during this titration study. - No cardiac abnormalities were observed during this study. - Clinically significant periodic limb movements were not noted during this study. Arousals associated with PLMs were rare.  DIAGNOSIS - Obstructive Sleep Apnea (327.23 [G47.33 ICD-10])  RECOMMENDATIONS - Trial of CPAP therapy on 13 cm H2O with a Small size Resmed Full Face Mask AirFit F20 mask and heated humidification. - Avoid alcohol, sedatives and other CNS depressants that may worsen sleep apnea and disrupt normal sleep architecture. - Sleep hygiene should be reviewed to assess factors that may improve sleep quality. - Weight management and regular exercise should be initiated or continued.  Deneise Lever Diplomate, American Board of Sleep Medicine  ELECTRONICALLY SIGNED ON:  09/11/2015, 11:15 AM [place] PH: [phone]   FX: [phone] [place]

## 2015-09-12 ENCOUNTER — Other Ambulatory Visit: Payer: Self-pay | Admitting: Physician Assistant

## 2015-09-12 DIAGNOSIS — G4733 Obstructive sleep apnea (adult) (pediatric): Secondary | ICD-10-CM | POA: Diagnosis not present

## 2015-09-12 DIAGNOSIS — G4700 Insomnia, unspecified: Secondary | ICD-10-CM | POA: Diagnosis not present

## 2015-09-12 MED ORDER — AMBULATORY NON FORMULARY MEDICATION: Status: AC

## 2015-09-20 ENCOUNTER — Telehealth: Payer: Self-pay | Admitting: Neurology

## 2015-09-20 NOTE — Telephone Encounter (Signed)
Rn call patient back wanting a point survey filled out for her diagnose of fibromyalgia. Rn explain she was last seen 10/2013. Pt stated the quality resources has records from [facility] that does not show a date or diagnose of fibromyalgia. Rn stated Dr.Sethi did put a diagnose and note on his office notes. Rn stated she may have the AVS that does not show diagnose and office assessment. Pt stated with the 50.00 fee to have the form out it may not get done. Rn requested patient have a release form sent from her rep for disability and office notes and and test can be fax back. Rn gave patient fax number of [phone]. Pt verbalized understanding,and will have a release form fax.

## 2015-09-20 NOTE — Telephone Encounter (Signed)
LFt vm for patient about pt needing tender point survey filled out this week for her diagnose of fibromyalgia. Pt was last seen 2015.

## 2015-09-20 NOTE — Telephone Encounter (Signed)
Patient is calling and states she needs a tender point survey filled out "this week" about her diagnosis of fibromyalgia as she is working toward a disability hearing.  I did state to her that there may be a $50 charge for doing this "if" Dr. Leonie Man could get this done.

## 2015-09-22 NOTE — Telephone Encounter (Signed)
I do not do tender point survey. Ask her to see PCP

## 2015-10-14 ENCOUNTER — Other Ambulatory Visit: Payer: Self-pay | Admitting: Physician Assistant

## 2015-11-04 ENCOUNTER — Encounter: Payer: Self-pay | Admitting: Physician Assistant

## 2015-11-04 ENCOUNTER — Ambulatory Visit (INDEPENDENT_AMBULATORY_CARE_PROVIDER_SITE_OTHER): Payer: BLUE CROSS/BLUE SHIELD | Admitting: Physician Assistant

## 2015-11-04 VITALS — BP 138/58 | HR 95 | Ht 63.0 in | Wt 211.0 lb

## 2015-11-04 DIAGNOSIS — M5136 Other intervertebral disc degeneration, lumbar region: Secondary | ICD-10-CM

## 2015-11-04 DIAGNOSIS — K2100 Gastro-esophageal reflux disease with esophagitis, without bleeding: Secondary | ICD-10-CM

## 2015-11-04 DIAGNOSIS — G8929 Other chronic pain: Secondary | ICD-10-CM | POA: Diagnosis not present

## 2015-11-04 DIAGNOSIS — K439 Ventral hernia without obstruction or gangrene: Secondary | ICD-10-CM | POA: Diagnosis not present

## 2015-11-04 DIAGNOSIS — H7290 Unspecified perforation of tympanic membrane, unspecified ear: Secondary | ICD-10-CM | POA: Insufficient documentation

## 2015-11-04 DIAGNOSIS — H7292 Unspecified perforation of tympanic membrane, left ear: Secondary | ICD-10-CM

## 2015-11-04 DIAGNOSIS — M797 Fibromyalgia: Secondary | ICD-10-CM | POA: Diagnosis not present

## 2015-11-04 MED ORDER — DULOXETINE HCL 60 MG PO CPEP: 60.0000 mg | ORAL_CAPSULE | Freq: Every day | ORAL | 5 refills | Status: DC

## 2015-11-04 MED ORDER — GABAPENTIN 600 MG PO TABS: ORAL_TABLET | ORAL | 3 refills | Status: DC

## 2015-11-04 MED ORDER — KETOROLAC TROMETHAMINE 60 MG/2ML IM SOLN
60.0000 mg | Freq: Once | INTRAMUSCULAR | Status: AC
  Administered 2015-11-04: 60 mg via INTRAMUSCULAR

## 2015-11-04 MED ORDER — CELECOXIB 200 MG PO CAPS
200.0000 mg | ORAL_CAPSULE | Freq: Two times a day (BID) | ORAL | 1 refills | Status: DC
Start: 2015-11-04 — End: 2016-06-29

## 2015-11-04 MED ORDER — OMEPRAZOLE 40 MG PO CPDR: 40.0000 mg | DELAYED_RELEASE_CAPSULE | Freq: Every day | ORAL | 5 refills | Status: DC

## 2015-11-04 MED ORDER — METHOCARBAMOL 500 MG PO TABS: 500.0000 mg | ORAL_TABLET | Freq: Three times a day (TID) | ORAL | 5 refills | Status: DC | PRN

## 2015-11-04 NOTE — Patient Instructions (Addendum)
Ventral Hernia A ventral hernia (also called an incisional hernia) is a hernia that occurs at the site of a previous surgical cut (incision) in the abdomen. The abdominal wall spans from your lower chest down to your pelvis. If the abdominal wall is weakened from a surgical incision, a hernia can occur. A hernia is a bulge of bowel or muscle tissue pushing out on the weakened part of the abdominal wall. Ventral hernias can get bigger from straining or lifting. Obese and older people are at higher risk for a ventral hernia. People who develop infections after surgery or require repeat incisions at the same site on the abdomen are also at increased risk. CAUSES  A ventral hernia occurs because of weakness in the abdominal wall. SYMPTOMS  Common symptoms include:  A visible bulge or lump on the abdominal wall.  Pain or tenderness around the lump.  Increased discomfort if you cough or make a sudden movement. If the hernia has blocked part of the intestine, a serious complication can occur (incarcerated or strangulated hernia). This can become a problem that requires emergency surgery because the blood flow to the blocked intestine may be cut off. Symptoms may include:  Feeling sick to your stomach (nauseous).  Throwing up (vomiting).  Stomach swelling (distention) or bloating.  Fever.  Rapid heartbeat. DIAGNOSIS  Your health care provider will take a medical history and perform a physical exam. Various tests may be ordered, such as:  Blood tests.  Urine tests.  Ultrasonography.  X-rays.  Computed tomography (CT). TREATMENT  Watchful waiting may be all that is needed for a smaller hernia that does not cause symptoms. Your health care provider may recommend the use of a supportive belt (truss) that helps to keep the abdominal wall intact. For larger hernias or those that cause pain, surgery to repair the hernia is usually recommended. If a hernia becomes strangulated, emergency surgery  needs to be done right away. HOME CARE INSTRUCTIONS  Avoid putting pressure or strain on the abdominal area.  Avoid heavy lifting.  Use good body positioning for physical tasks. Ask your health care provider about proper body positioning.  Use a supportive belt as directed by your health care provider.  Maintain a healthy weight.  Eat foods that are high in fiber, such as whole grains, fruits, and vegetables. Fiber helps prevent difficult bowel movements (constipation).  Drink enough fluids to keep your urine clear or pale yellow.  Follow up with your health care provider as directed. SEEK MEDICAL CARE IF:   Your hernia seems to be getting larger or more painful. SEEK IMMEDIATE MEDICAL CARE IF:   You have abdominal pain that is sudden and sharp.  Your pain becomes severe.  You have repeated vomiting.  You are sweating a lot.  You notice a rapid heartbeat.  You develop a fever. MAKE SURE YOU:   Understand these instructions.  Will watch your condition.  Will get help right away if you are not doing well or get worse.   This information is not intended to replace advice given to you by your health care provider. Make sure you discuss any questions you have with your health care provider.   Document Released: 02/13/2012 Document Revised: 03/19/2014 Document Reviewed: 02/13/2012 Elsevier Interactive Patient Education 2016 [place] increased.  Gabapentin increased.  Robaxin refilled.  Added celebrex.  Will make referral to general surgery for ventral hernia.   Omeprazole daily for GI issues.  TM with perforation- need to send to ENT.

## 2015-11-07 ENCOUNTER — Telehealth: Payer: Self-pay | Admitting: *Deleted

## 2015-11-07 ENCOUNTER — Ambulatory Visit: Payer: BLUE CROSS/BLUE SHIELD | Admitting: Physician Assistant

## 2015-11-07 NOTE — Telephone Encounter (Signed)
PA initiated through covermymeds for [id number]

## 2015-11-07 NOTE — Progress Notes (Signed)
   Subjective:    Patient ID: Becky Gibson, female    DOB: 12/07/1970, 45 y.o.   MRN: [id number]  HPI  Pt presents to the clinic toGet medication refills today.  She is having some ongoing a fairly new indigestion. She is taking Tums regularly. Seems to help some. At times she takes the metallic taste in her mouth. Tends to be worse after eating and when lying down. Tums to help feel better. She denies any bowel movement changes.  Patient has ongoing chronic pain. She is on Cymbalta, Robaxin, Neurontin. She does feel like this manages pain somewhat. She does not want to go on pain medicine. She has been out of all of these for the last week and all of her pain has worsened.  Patient has had some abdominal discomfort for the past month or so. At times she sees a bulge in her abdomen. The bulge goes away when she relaxes. She denies any nausea or vomiting.  Her left ear is a little itchy and at times feels like there could be some hearing loss. She would like it checked today.     Review of Systems See HPI.     Objective:   Physical Exam  Constitutional: She appears well-developed and well-nourished.  HENT:  Head: Normocephalic and atraumatic.  Right Ear: External ear normal.  Left Ear: External ear normal.  Left TM perforation. No blood or pus. No tenderness to palpation.   Eyes: Conjunctivae are normal. Right eye exhibits no discharge. Left eye exhibits no discharge.  Cardiovascular: Normal rate, regular rhythm and normal heart sounds.   Pulmonary/Chest: Effort normal and breath sounds normal.  Abdominal:  8cm by 4cm ventral hernia from epigastric region to right above umbilical. Only seen with flexion of abdominal muscles.   Musculoskeletal:  Limited ROM in all directions due to pain.  Pain to palpation over all paraspinous muscles from neck to lower back.   Psychiatric: She has a normal mood and affect. Her behavior is normal.          Assessment & Plan:  GERD- omeprazole  started.   Ventral hernia- Will refer to general surgery. HO given.   Perforated left ear drum- unclear how long this has been present. Discussed no water immersion. Will refer to ENT.   Chronic pain/lumbar DDD/fibromyaglia- toradol 60mg  IM given today. Refilled robaxin. cymbalta increased. Gabapentin increased. Added celebrex. Stop mobic. Encouraged massages.

## 2015-11-21 ENCOUNTER — Other Ambulatory Visit (HOSPITAL_COMMUNITY): Payer: Self-pay | Admitting: Surgery

## 2015-11-21 ENCOUNTER — Ambulatory Visit: Payer: Self-pay | Admitting: Surgery

## 2015-11-21 DIAGNOSIS — R1083 Colic: Secondary | ICD-10-CM

## 2015-11-22 ENCOUNTER — Other Ambulatory Visit: Payer: Self-pay | Admitting: Surgery

## 2015-11-22 DIAGNOSIS — R1084 Generalized abdominal pain: Secondary | ICD-10-CM

## 2015-12-02 ENCOUNTER — Ambulatory Visit
Admission: RE | Admit: 2015-12-02 | Discharge: 2015-12-02 | Disposition: A | Discharge status: Home / Self Care | Payer: BLUE CROSS/BLUE SHIELD | Source: Ambulatory Visit | Attending: Surgery | Admitting: Surgery

## 2015-12-02 DIAGNOSIS — R1084 Generalized abdominal pain: Secondary | ICD-10-CM

## 2015-12-02 MED ORDER — IOPAMIDOL (ISOVUE-300) INJECTION 61%
125.0000 mL | Freq: Once | INTRAVENOUS | Status: AC | PRN
  Administered 2015-12-02: 125 mL via INTRAVENOUS

## 2015-12-06 NOTE — Telephone Encounter (Signed)
Checked on status and  celebrex has been approved through [employer] (Key: [id number]) [id number] Celecoxib 200MG  capsules Status: PA Response - Approved  Patient asked about her CT scan that the surgeon ordered and had multiple questions. I advised her that she should schedule a 30 min appt to talk about concerns with Luvenia Starch

## 2015-12-06 NOTE — Telephone Encounter (Signed)
Pharm notified.

## 2015-12-07 ENCOUNTER — Ambulatory Visit (HOSPITAL_COMMUNITY): Payer: BLUE CROSS/BLUE SHIELD

## 2015-12-07 NOTE — Telephone Encounter (Signed)
Closed

## 2015-12-26 ENCOUNTER — Ambulatory Visit (HOSPITAL_COMMUNITY): Admission: RE | Admit: 2015-12-26 | Payer: BLUE CROSS/BLUE SHIELD | Source: Ambulatory Visit

## 2015-12-26 ENCOUNTER — Ambulatory Visit (HOSPITAL_COMMUNITY): Payer: BLUE CROSS/BLUE SHIELD

## 2016-06-07 ENCOUNTER — Other Ambulatory Visit: Payer: Self-pay | Admitting: Physician Assistant

## 2016-06-19 ENCOUNTER — Encounter: Payer: BLUE CROSS/BLUE SHIELD | Admitting: Physician Assistant

## 2016-06-29 ENCOUNTER — Other Ambulatory Visit (HOSPITAL_COMMUNITY)
Admission: RE | Admit: 2016-06-29 | Discharge: 2016-06-29 | Disposition: A | Discharge status: Home / Self Care | Payer: BLUE CROSS/BLUE SHIELD | Source: Ambulatory Visit | Attending: Physician Assistant | Admitting: Physician Assistant

## 2016-06-29 ENCOUNTER — Encounter: Payer: Self-pay | Admitting: Physician Assistant

## 2016-06-29 ENCOUNTER — Ambulatory Visit (INDEPENDENT_AMBULATORY_CARE_PROVIDER_SITE_OTHER): Payer: BLUE CROSS/BLUE SHIELD | Admitting: Physician Assistant

## 2016-06-29 VITALS — BP 142/82 | HR 114 | Ht 63.0 in | Wt 211.0 lb

## 2016-06-29 DIAGNOSIS — E010 Iodine-deficiency related diffuse (endemic) goiter: Secondary | ICD-10-CM | POA: Diagnosis not present

## 2016-06-29 DIAGNOSIS — E041 Nontoxic single thyroid nodule: Secondary | ICD-10-CM | POA: Diagnosis not present

## 2016-06-29 DIAGNOSIS — Z808 Family history of malignant neoplasm of other organs or systems: Secondary | ICD-10-CM | POA: Diagnosis not present

## 2016-06-29 DIAGNOSIS — Z1231 Encounter for screening mammogram for malignant neoplasm of breast: Secondary | ICD-10-CM | POA: Diagnosis not present

## 2016-06-29 DIAGNOSIS — Z01419 Encounter for gynecological examination (general) (routine) without abnormal findings: Secondary | ICD-10-CM

## 2016-06-29 DIAGNOSIS — Z13220 Encounter for screening for lipoid disorders: Secondary | ICD-10-CM

## 2016-06-29 DIAGNOSIS — K219 Gastro-esophageal reflux disease without esophagitis: Secondary | ICD-10-CM | POA: Diagnosis not present

## 2016-06-29 DIAGNOSIS — M797 Fibromyalgia: Secondary | ICD-10-CM

## 2016-06-29 DIAGNOSIS — N6310 Unspecified lump in the right breast, unspecified quadrant: Secondary | ICD-10-CM

## 2016-06-29 DIAGNOSIS — Z0000 Encounter for general adult medical examination without abnormal findings: Secondary | ICD-10-CM

## 2016-06-29 DIAGNOSIS — Z131 Encounter for screening for diabetes mellitus: Secondary | ICD-10-CM | POA: Diagnosis not present

## 2016-06-29 MED ORDER — CELECOXIB 200 MG PO CAPS: 200.0000 mg | ORAL_CAPSULE | Freq: Two times a day (BID) | ORAL | 5 refills | Status: DC

## 2016-06-29 MED ORDER — DULOXETINE HCL 60 MG PO CPEP: 60.0000 mg | ORAL_CAPSULE | Freq: Every day | ORAL | 5 refills | Status: DC

## 2016-06-29 MED ORDER — GABAPENTIN 600 MG PO TABS: ORAL_TABLET | ORAL | 5 refills | Status: DC

## 2016-06-29 MED ORDER — OMEPRAZOLE 40 MG PO CPDR: 40.0000 mg | DELAYED_RELEASE_CAPSULE | Freq: Every day | ORAL | 5 refills | Status: DC

## 2016-06-29 NOTE — Progress Notes (Signed)
Subjective:     Becky Gibson is a 46 y.o. female and is here for a comprehensive physical exam. The patient reports problems - 2 months neck mass on the left side of neck that seems to be enlarging. family hx of thyroid cancer. she has a personal hx of thyroid nodule and normal biopsy in 2015. [name] Pt is also having some worsening GERD and reflux. Omeprazole daily but does not feel like helping. She is overweight and continues to gain.  Social History   Social History  . Marital status: Married    Spouse name: N/A  . Number of children: 3  . Years of education: college   Occupational History  . n/a    Social History Main Topics  . Smoking status: Current Every Day Smoker  . Smokeless tobacco: Never Used  . Alcohol use No  . Drug use: No  . Sexual activity: Yes    Birth control/ protection: None   Other Topics Concern  . Not on file   Social History Narrative   Patient lives at home with her family.   Patient is right handed   Patient  Drinks coffee daily   Health Maintenance  Topic Date Due  . PAP SMEAR  01/30/2016  . INFLUENZA VACCINE  10/10/2016  . TETANUS/TDAP  01/30/2023  . HIV Screening  Completed    The following portions of the patient's history were reviewed and updated as appropriate: allergies, current medications, past family history, past medical history, past social history, past surgical history and problem list.  Review of Systems Pertinent items noted in HPI and remainder of comprehensive ROS otherwise negative.   Objective:    BP (!) 142/82   Pulse (!) 114   Ht 5\' 3"  (1.6 m)   Wt 211 lb (95.7 kg)   BMI 37.38 kg/m  General appearance: alert, cooperative and appears stated age Head: Normocephalic, without obvious abnormality, atraumatic Eyes: conjunctivae/corneas clear. PERRL, EOM's intact. Fundi benign. Ears: normal TM's and external ear canals both ears Nose: Nares normal. Septum midline. Mucosa normal. No drainage or sinus tenderness. Throat:  lips, mucosa, and tongue normal; teeth and gums normal Neck: no adenopathy, no carotid bruit, no JVD, supple, symmetrical, trachea midline and thyroid: nodular Back: symmetric, no curvature. ROM normal. No CVA tenderness. Lungs: clear to auscultation bilaterally Heart: regular rate and rhythm, S1, S2 normal, no murmur, click, rub or gallop Abdomen: soft, non-tender; bowel sounds normal; no masses,  no organomegaly Pelvic: cervix normal in appearance, external genitalia normal, no adnexal masses or tenderness, no cervical motion tenderness, rectovaginal septum normal, uterus normal size, shape, and consistency and vagina normal without discharge Extremities: extremities normal, atraumatic, no cyanosis or edema Pulses: 2+ and symmetric Skin: Skin color, texture, turgor normal. No rashes or lesions Lymph nodes: Cervical, supraclavicular, and axillary nodes normal. Neurologic: Alert and oriented X 3, normal strength and tone. Normal symmetric reflexes. Normal coordination and gait    Assessment:    Healthy female exam.      Plan:  Marland KitchenMarland KitchenClay was seen today for annual exam.  Diagnoses and all orders for this visit:  Routine physical examination -     Cancel: MM SCREENING BREAST TOMO BILATERAL; Future -     US SOFT TISSUE HEAD AND NECK; Future -     Lipid panel -     COMPLETE METABOLIC PANEL WITH GFR -     TSH -     PTH, Intact and Calcium -     B12 -  VITAMIN D 25 Hydroxy (Vit-D Deficiency, Fractures) -     CBC with Differential/Platelet -     Cancel: MM SCREENING BREAST TOMO BILATERAL -     US SOFT TISSUE HEAD AND NECK  Pap smear, as part of routine gynecological examination -     Cytology - PAP  Thyroid nodule -     US SOFT TISSUE HEAD AND NECK; Future -     US SOFT TISSUE HEAD AND NECK  Fibromyalgia -     celecoxib (CELEBREX) 200 MG capsule; Take 1 capsule (200 mg total) by mouth 2 (two) times daily. -     DULoxetine (CYMBALTA) 60 MG capsule; Take 1 capsule (60 mg total) by  mouth daily. -     gabapentin (NEURONTIN) 600 MG tablet; Take one tablet three times a day.  Visit for screening mammogram -     Cancel: MM SCREENING BREAST TOMO BILATERAL; Future -     Cancel: MM SCREENING BREAST TOMO BILATERAL  Family history of thyroid cancer -     US SOFT TISSUE HEAD AND NECK; Future -     US SOFT TISSUE HEAD AND NECK  Thyromegaly -     US SOFT TISSUE HEAD AND NECK; Future -     US SOFT TISSUE HEAD AND NECK  Screening for lipoid disorders -     Lipid panel  Screening for diabetes mellitus -     COMPLETE METABOLIC PANEL WITH GFR  Breast mass, right -     Cancel: MM Digital Diagnostic Bilat -     US BREAST LTD UNI RIGHT INC AXILLA -     MM DIAG BREAST TOMO BILATERAL  Gastroesophageal reflux disease, esophagitis presence not specified -     omeprazole (PRILOSEC) 40 MG capsule; Take 1 capsule (40 mg total) by mouth daily.  .. Pt request GI referral.    See After Visit Summary for Counseling Recommendations

## 2016-06-29 NOTE — Patient Instructions (Addendum)
Mammogram U/s of thyroid Endoscopy

## 2016-06-30 LAB — CBC WITH DIFFERENTIAL/PLATELET
BASOS ABS: 0 {cells}/uL (ref 0–200)
Basophils Relative: 0 %
EOS PCT: 2 %
Eosinophils Absolute: 224 cells/uL (ref 15–500)
HCT: 46.5 % — ABNORMAL HIGH (ref 35.0–45.0)
Hemoglobin: 15.1 g/dL (ref 11.7–15.5)
Lymphocytes Relative: 23 %
Lymphs Abs: 2576 cells/uL (ref 850–3900)
MCH: 27.8 pg (ref 27.0–33.0)
MCHC: 32.5 g/dL (ref 32.0–36.0)
MCV: 85.6 fL (ref 80.0–100.0)
MONOS PCT: 6 %
MPV: 8.4 fL (ref 7.5–12.5)
Monocytes Absolute: 672 cells/uL (ref 200–950)
NEUTROS ABS: 7728 {cells}/uL (ref 1500–7800)
Neutrophils Relative %: 69 %
PLATELETS: 318 10*3/uL (ref 140–400)
RBC: 5.43 MIL/uL — ABNORMAL HIGH (ref 3.80–5.10)
RDW: 14.3 % (ref 11.0–15.0)
WBC: 11.2 10*3/uL — ABNORMAL HIGH (ref 3.8–10.8)

## 2016-06-30 LAB — LIPID PANEL
CHOL/HDL RATIO: 7 ratio — AB (ref <5.0)
Cholesterol: 216 mg/dL — ABNORMAL HIGH (ref <200)
HDL: 31 mg/dL — ABNORMAL LOW (ref >50)
LDL Cholesterol: 121 mg/dL — ABNORMAL HIGH (ref <100)
Triglycerides: 321 mg/dL — ABNORMAL HIGH (ref <150)
VLDL: 64 mg/dL — AB (ref <30)

## 2016-06-30 LAB — COMPLETE METABOLIC PANEL WITH GFR
ALT: 12 U/L (ref 6–29)
AST: 11 U/L (ref 10–35)
Albumin: 4.4 g/dL (ref 3.6–5.1)
Alkaline Phosphatase: 92 U/L (ref 33–115)
BUN: 13 mg/dL (ref 7–25)
CALCIUM: 9.6 mg/dL (ref 8.6–10.2)
CHLORIDE: 104 mmol/L (ref 98–110)
CO2: 24 mmol/L (ref 20–31)
Creat: 0.61 mg/dL (ref 0.50–1.10)
GFR, Est African American: >89 mL/min (ref >=60)
GFR, Est Non African American: >89 mL/min (ref >=60)
GLUCOSE: 90 mg/dL (ref 65–99)
POTASSIUM: 4.4 mmol/L (ref 3.5–5.3)
SODIUM: 139 mmol/L (ref 135–146)
Total Bilirubin: 0.4 mg/dL (ref 0.2–1.2)
Total Protein: 7 g/dL (ref 6.1–8.1)

## 2016-06-30 LAB — VITAMIN D 25 HYDROXY (VIT D DEFICIENCY, FRACTURES): VIT D 25 HYDROXY: 16 ng/mL — AB (ref 30–100)

## 2016-06-30 LAB — VITAMIN B12: VITAMIN B 12: 285 pg/mL (ref 200–1100)

## 2016-06-30 LAB — TSH: TSH: 0.59 m[IU]/L

## 2016-07-02 ENCOUNTER — Other Ambulatory Visit: Payer: BLUE CROSS/BLUE SHIELD

## 2016-07-03 ENCOUNTER — Encounter: Payer: Self-pay | Admitting: Physician Assistant

## 2016-07-03 ENCOUNTER — Ambulatory Visit (INDEPENDENT_AMBULATORY_CARE_PROVIDER_SITE_OTHER): Payer: BLUE CROSS/BLUE SHIELD

## 2016-07-03 DIAGNOSIS — E538 Deficiency of other specified B group vitamins: Secondary | ICD-10-CM | POA: Insufficient documentation

## 2016-07-03 DIAGNOSIS — E042 Nontoxic multinodular goiter: Secondary | ICD-10-CM | POA: Diagnosis not present

## 2016-07-03 DIAGNOSIS — E781 Pure hyperglyceridemia: Secondary | ICD-10-CM | POA: Insufficient documentation

## 2016-07-03 LAB — CYTOLOGY - PAP
Diagnosis: NEGATIVE
HPV (WINDOPATH): NOT DETECTED

## 2016-07-03 LAB — PTH, INTACT AND CALCIUM
CALCIUM: 9.6 mg/dL (ref 8.6–10.2)
PTH: 61 pg/mL (ref 14–64)

## 2016-07-03 NOTE — Progress Notes (Signed)
Call pt: multinodular thyroid but none other than previous biopsied nodule(which was normal) meet biopsy criteria. No concerns other than watch to make sure no changes.

## 2016-07-03 NOTE — Progress Notes (Signed)
Call pt: B12 low. Would you like to try shots? Vitamin d really low please send 50,000 units weekly for 3 months then recheck.  Pap normal cells and negative HPV.  TG elevated. Watch sugars and processed foods. Start fish oil 4000mg  daily.

## 2016-07-04 ENCOUNTER — Ambulatory Visit: Admission: RE | Admit: 2016-07-04 | Payer: BLUE CROSS/BLUE SHIELD | Source: Ambulatory Visit

## 2016-07-04 ENCOUNTER — Ambulatory Visit
Admission: RE | Admit: 2016-07-04 | Discharge: 2016-07-04 | Disposition: A | Discharge status: Home / Self Care | Payer: BLUE CROSS/BLUE SHIELD | Source: Ambulatory Visit | Attending: Physician Assistant | Admitting: Physician Assistant

## 2016-07-04 ENCOUNTER — Other Ambulatory Visit: Payer: Self-pay

## 2016-07-04 MED ORDER — VITAMIN D (ERGOCALCIFEROL) 1.25 MG (50000 UNIT) PO CAPS: 50000.0000 [IU] | ORAL_CAPSULE | ORAL | 0 refills | Status: DC

## 2017-02-12 ENCOUNTER — Other Ambulatory Visit: Payer: Self-pay | Admitting: Physician Assistant

## 2017-02-19 ENCOUNTER — Other Ambulatory Visit: Payer: Self-pay | Admitting: Physician Assistant

## 2017-02-19 DIAGNOSIS — M797 Fibromyalgia: Secondary | ICD-10-CM

## 2017-03-18 ENCOUNTER — Other Ambulatory Visit: Payer: Self-pay | Admitting: Physician Assistant

## 2017-03-18 DIAGNOSIS — K219 Gastro-esophageal reflux disease without esophagitis: Secondary | ICD-10-CM

## 2017-04-12 IMAGING — CR DG THORACIC SPINE 3V
3 series · 3 of 3 positions shown · non-contrast
Comparison: MRI 04/05/2014.

CLINICAL DATA: Upper back pain.  Numbness.  No known injury.

EXAM:
THORACIC SPINE - 3 VIEWS

[t-spine ap]
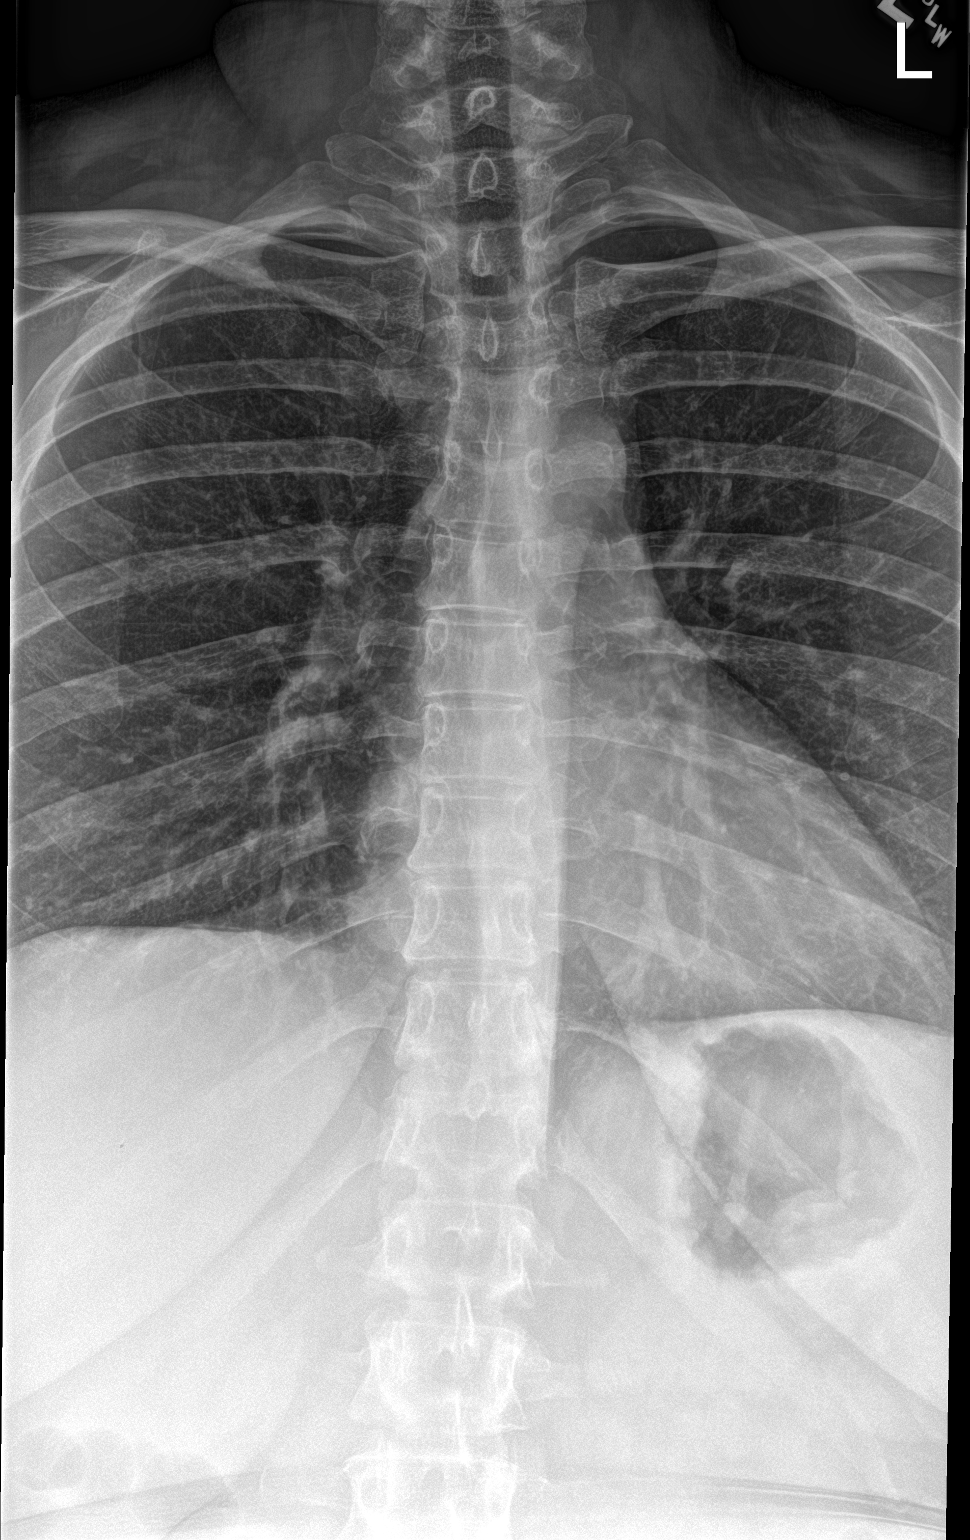

[t-spine lat]
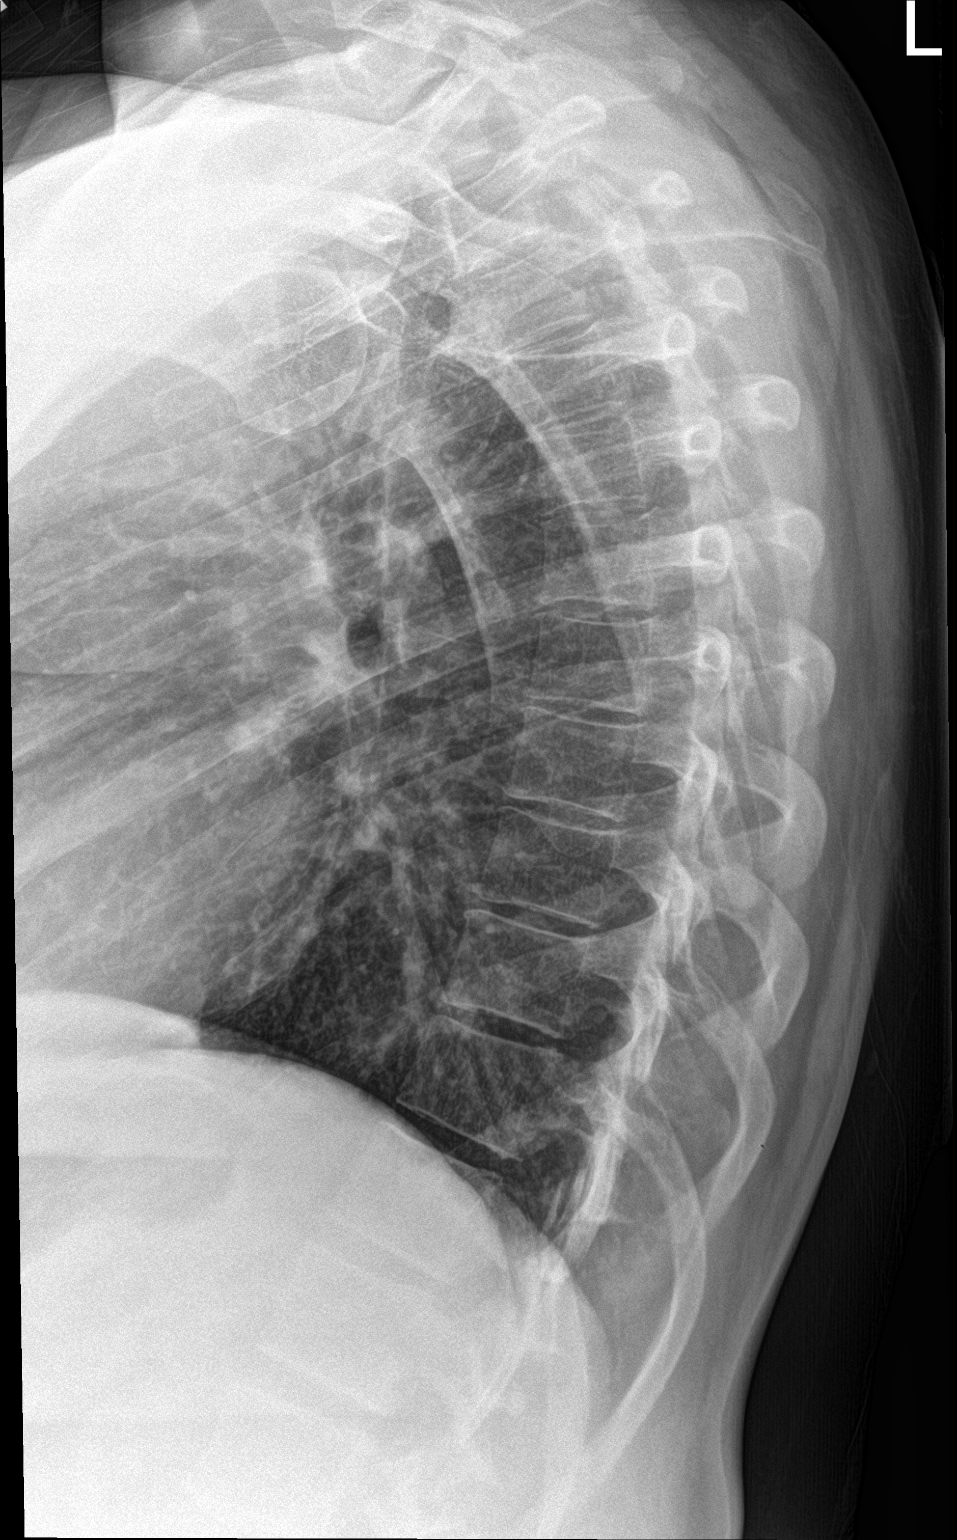

[t-spine swimmers]
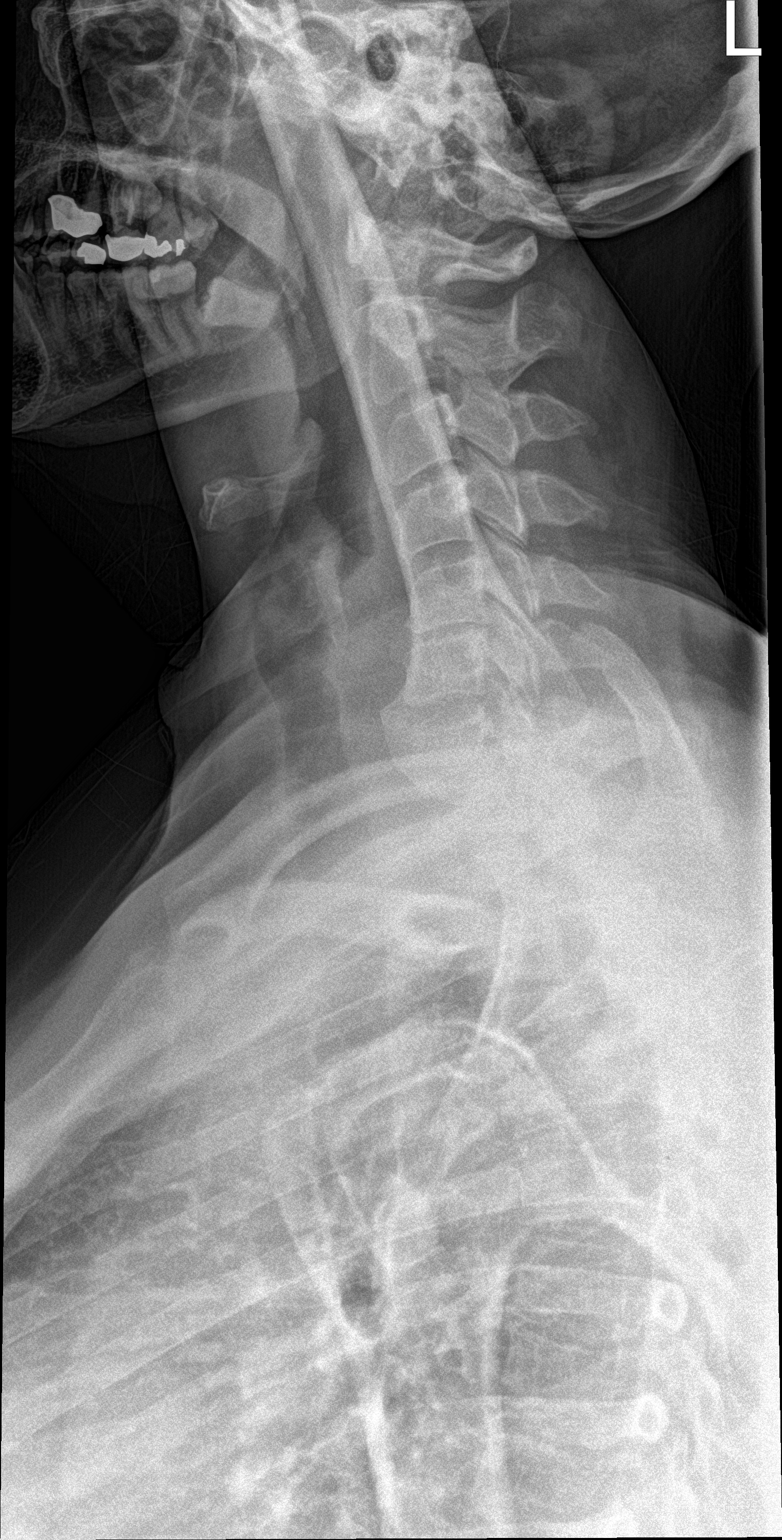

[3 of 3 positions shown; findings below may reference images not displayed]

FINDINGS: Paraspinal soft tissues are normal. No acute bony abnormality
identified. Normal mineralization . Mild thoracic spine scoliosis.
IMPRESSION: No acute abnormality.  Mild thoracic spine scoliosis.

## 2017-04-12 IMAGING — CR DG CERVICAL SPINE COMPLETE 4+V
5 series · 5 of 5 positions shown · non-contrast
Comparison: None in PACs

CLINICAL DATA: Six months of bilateral neck pain and upper
extremity numbness without known injury

EXAM:
CERVICAL SPINE - COMPLETE 4+ VIEW

[c-spine lat]
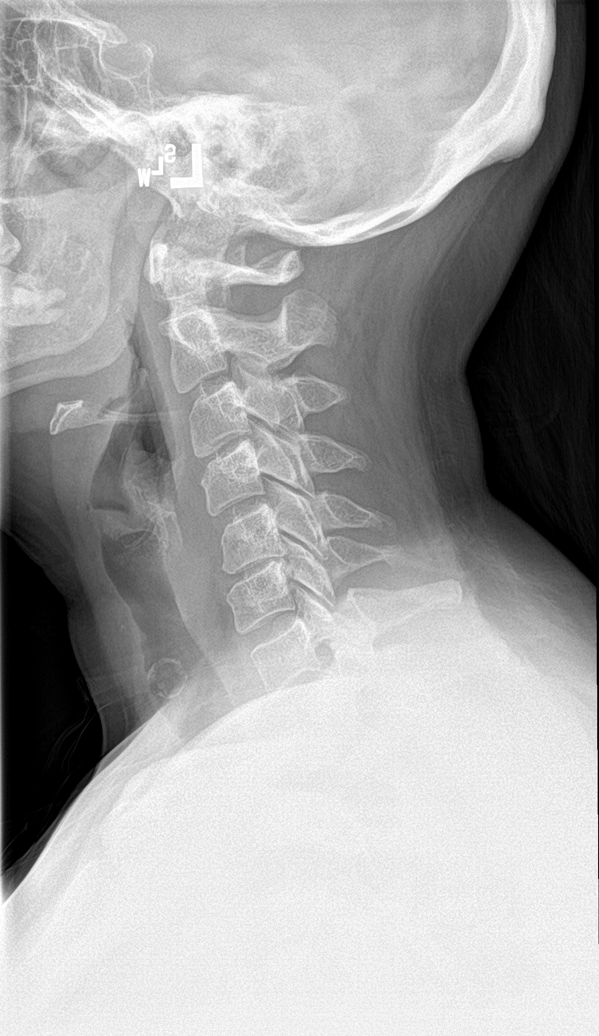

[c-spine obl (1 of 2)]
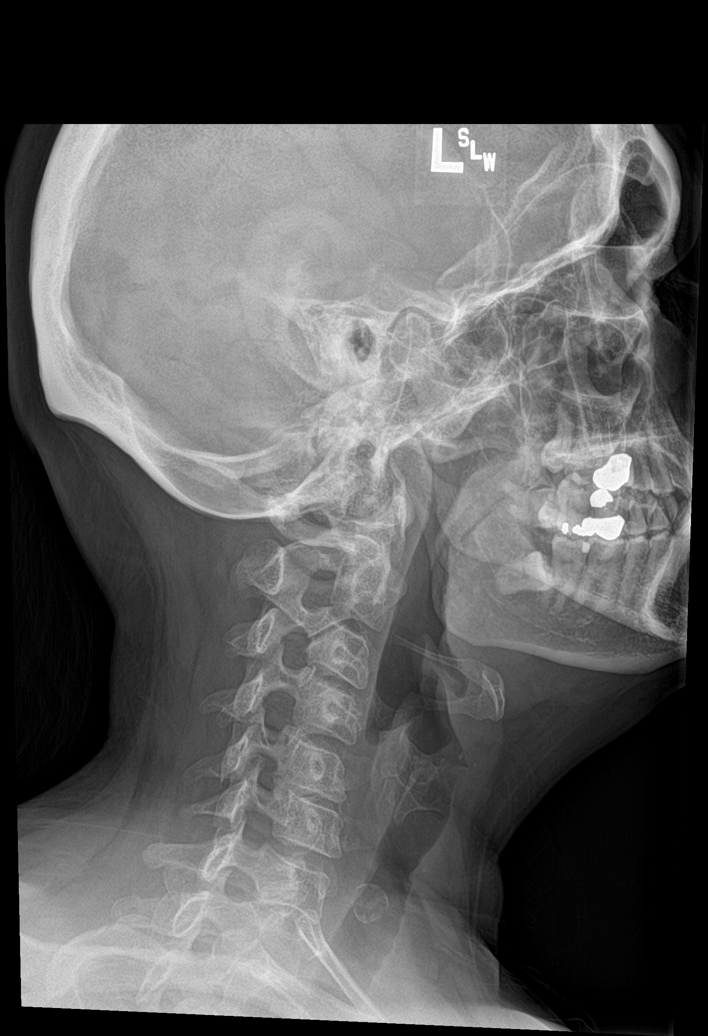

[c-spine obl (2 of 2)]
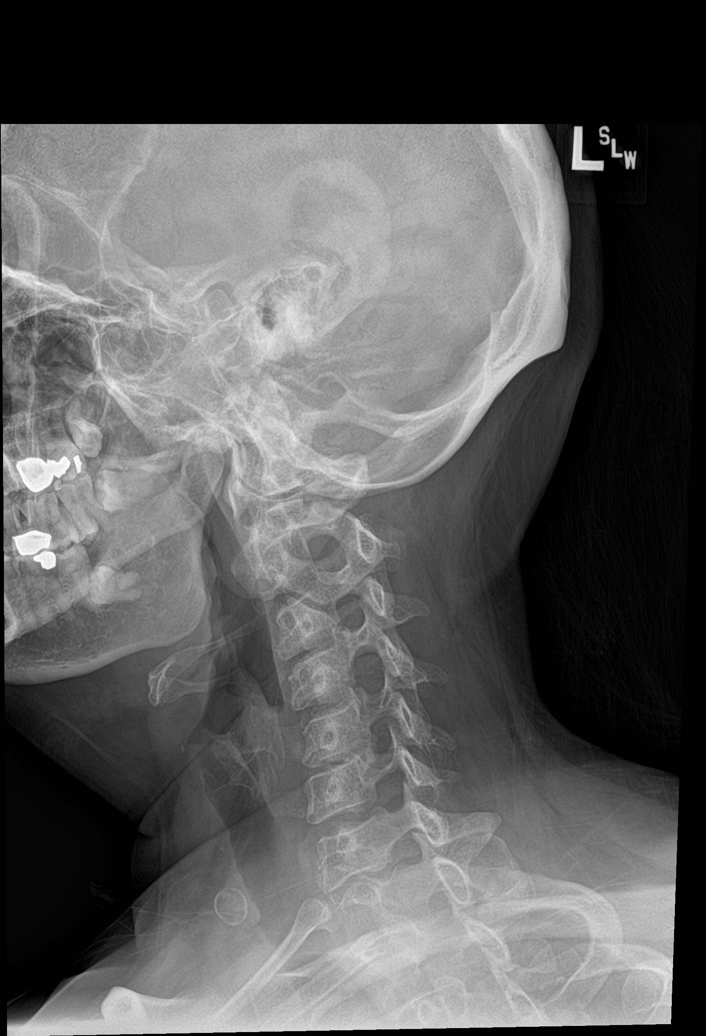

[c-spine ap]
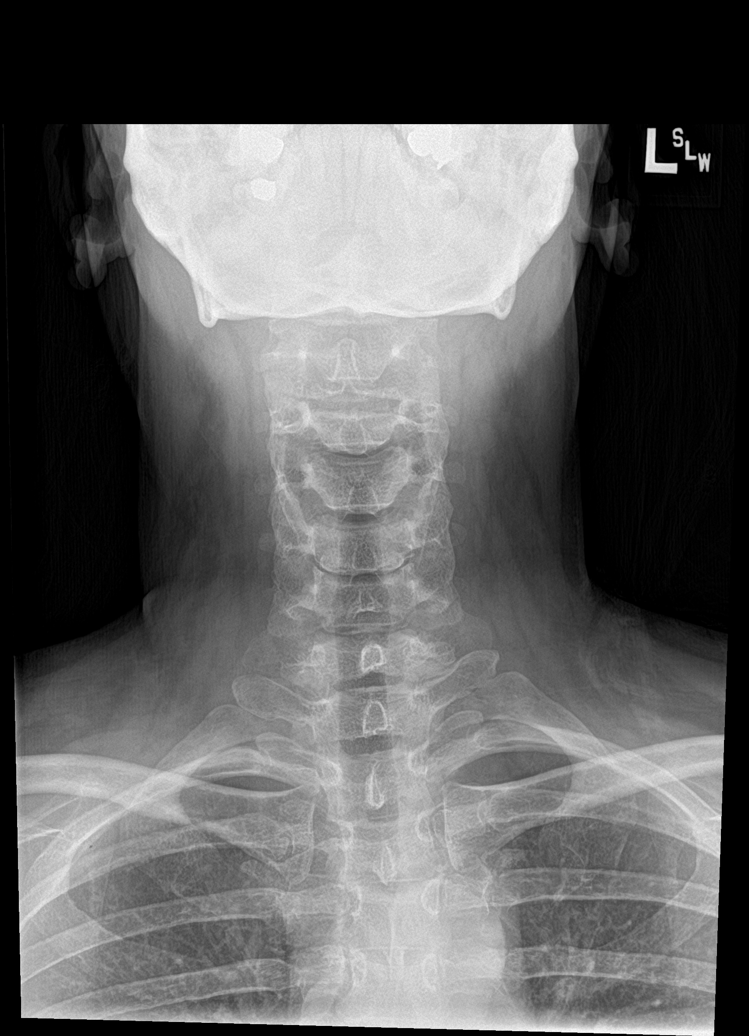

[c-spine open mouth]
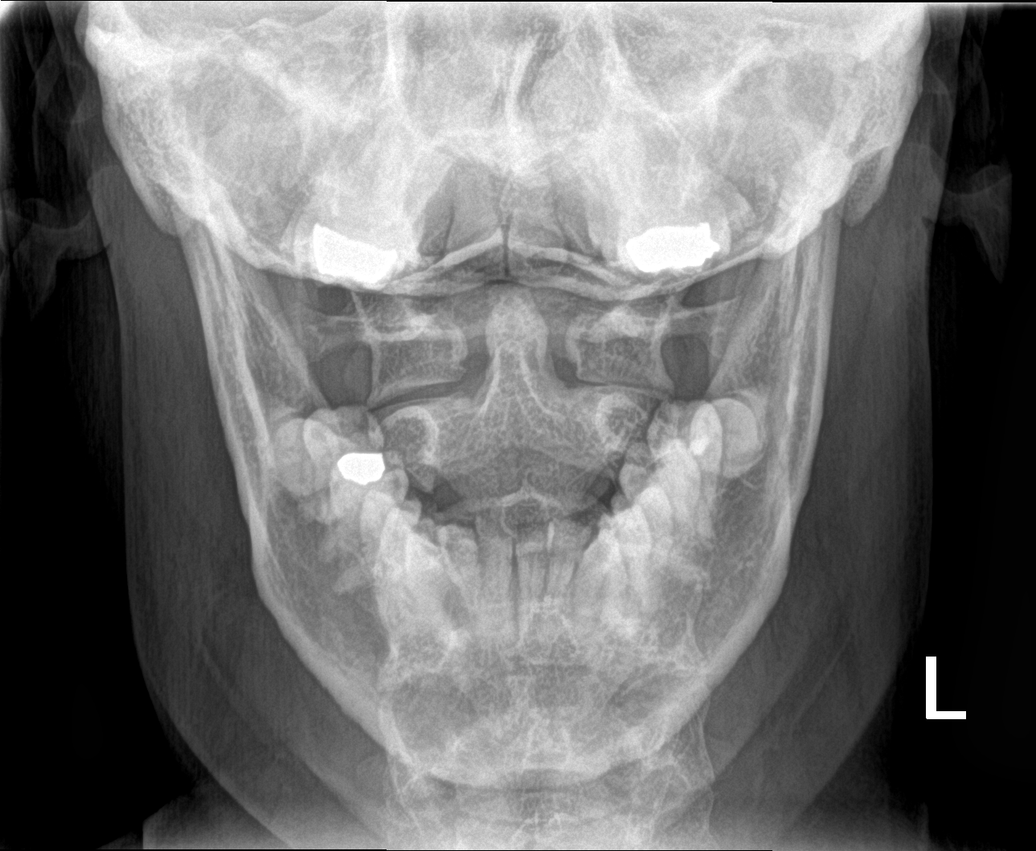

[5 of 5 positions shown; findings below may reference images not displayed]

FINDINGS: There is loss of the normal cervical lordosis. The vertebral bodies
are preserved in height. There is moderate disc space narrowing at
C5-6. There is approximately 2 mm of anterior displacement of C6
with respect to C5. There is no perched facet. The spinous processes
are intact. There is mild bony encroachment upon the neural foramina
bilaterally at C5. The prevertebral soft tissue spaces are normal.
The odontoid is intact.
IMPRESSION: There is degenerative disc and facet joint and uncovertebral joint
change at C5-6. There is 2 mm of anterior displacement of C6 with
respect to C5. Given the patient's symptoms, MRI of the cervical
spine would be a useful next imaging step.

## 2017-06-13 ENCOUNTER — Ambulatory Visit (INDEPENDENT_AMBULATORY_CARE_PROVIDER_SITE_OTHER): Payer: Managed Care, Other (non HMO)

## 2017-06-13 ENCOUNTER — Encounter: Payer: Self-pay | Admitting: Physician Assistant

## 2017-06-13 ENCOUNTER — Ambulatory Visit (INDEPENDENT_AMBULATORY_CARE_PROVIDER_SITE_OTHER): Payer: Managed Care, Other (non HMO) | Admitting: Physician Assistant

## 2017-06-13 VITALS — BP 145/69 | HR 96 | Ht 63.0 in | Wt 213.0 lb

## 2017-06-13 DIAGNOSIS — M79644 Pain in right finger(s): Secondary | ICD-10-CM | POA: Diagnosis not present

## 2017-06-13 DIAGNOSIS — Z131 Encounter for screening for diabetes mellitus: Secondary | ICD-10-CM | POA: Diagnosis not present

## 2017-06-13 DIAGNOSIS — M25511 Pain in right shoulder: Secondary | ICD-10-CM

## 2017-06-13 DIAGNOSIS — E010 Iodine-deficiency related diffuse (endemic) goiter: Secondary | ICD-10-CM

## 2017-06-13 DIAGNOSIS — M797 Fibromyalgia: Secondary | ICD-10-CM | POA: Diagnosis not present

## 2017-06-13 DIAGNOSIS — Z0000 Encounter for general adult medical examination without abnormal findings: Secondary | ICD-10-CM | POA: Diagnosis not present

## 2017-06-13 DIAGNOSIS — E042 Nontoxic multinodular goiter: Secondary | ICD-10-CM

## 2017-06-13 DIAGNOSIS — Z13220 Encounter for screening for lipoid disorders: Secondary | ICD-10-CM

## 2017-06-13 MED ORDER — CELECOXIB 200 MG PO CAPS: 200.0000 mg | ORAL_CAPSULE | Freq: Two times a day (BID) | ORAL | 5 refills | Status: DC

## 2017-06-13 NOTE — Patient Instructions (Signed)
cpe in 4 weeks.

## 2017-06-13 NOTE — Progress Notes (Signed)
Normal xray of right finger.

## 2017-06-13 NOTE — Progress Notes (Signed)
Normal xray of right shoulder. If this represents bursitis injection should help. We could consider MRI but I would also like to see with orthopedist says.
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
## 2017-06-13 NOTE — Progress Notes (Signed)
Call pt: she does have multiple nodules seen. Most of them are stable and do not meet biopsy criteria. You do have one that since cytology showed indeterminate cells we could re-biopsy. Are you ok with this referral?

## 2017-06-14 ENCOUNTER — Other Ambulatory Visit: Payer: Self-pay | Admitting: *Deleted

## 2017-06-14 DIAGNOSIS — M797 Fibromyalgia: Secondary | ICD-10-CM

## 2017-06-14 MED ORDER — DULOXETINE HCL 60 MG PO CPEP: 60.0000 mg | ORAL_CAPSULE | Freq: Every day | ORAL | 1 refills | Status: DC

## 2017-06-17 NOTE — Progress Notes (Signed)
Referral made 

## 2017-06-17 NOTE — Progress Notes (Signed)
Done

## 2018-03-03 ENCOUNTER — Other Ambulatory Visit: Payer: Self-pay | Admitting: Family Medicine

## 2018-03-03 DIAGNOSIS — M797 Fibromyalgia: Secondary | ICD-10-CM

## 2018-03-07 ENCOUNTER — Other Ambulatory Visit: Payer: Self-pay

## 2018-03-07 DIAGNOSIS — M797 Fibromyalgia: Secondary | ICD-10-CM

## 2018-03-07 MED ORDER — DULOXETINE HCL 60 MG PO CPEP: 60.0000 mg | ORAL_CAPSULE | Freq: Every day | ORAL | 0 refills | Status: DC

## 2018-03-18 ENCOUNTER — Ambulatory Visit: Payer: Managed Care, Other (non HMO) | Admitting: Physician Assistant

## 2018-04-08 ENCOUNTER — Other Ambulatory Visit: Payer: Self-pay | Admitting: Physician Assistant

## 2018-04-08 DIAGNOSIS — M797 Fibromyalgia: Secondary | ICD-10-CM

## 2018-05-09 ENCOUNTER — Other Ambulatory Visit: Payer: Self-pay | Admitting: Physician Assistant

## 2018-05-09 DIAGNOSIS — M797 Fibromyalgia: Secondary | ICD-10-CM

## 2018-05-14 ENCOUNTER — Encounter: Payer: Self-pay | Admitting: Physician Assistant

## 2018-05-14 ENCOUNTER — Ambulatory Visit (INDEPENDENT_AMBULATORY_CARE_PROVIDER_SITE_OTHER): Payer: Managed Care, Other (non HMO) | Admitting: Physician Assistant

## 2018-05-14 VITALS — BP 136/82 | HR 82 | Temp 98.5°F | Ht 63.0 in | Wt 203.0 lb

## 2018-05-14 DIAGNOSIS — E6609 Other obesity due to excess calories: Secondary | ICD-10-CM

## 2018-05-14 DIAGNOSIS — Z131 Encounter for screening for diabetes mellitus: Secondary | ICD-10-CM | POA: Diagnosis not present

## 2018-05-14 DIAGNOSIS — Z0000 Encounter for general adult medical examination without abnormal findings: Secondary | ICD-10-CM

## 2018-05-14 DIAGNOSIS — Z716 Tobacco abuse counseling: Secondary | ICD-10-CM

## 2018-05-14 DIAGNOSIS — Z1231 Encounter for screening mammogram for malignant neoplasm of breast: Secondary | ICD-10-CM

## 2018-05-14 DIAGNOSIS — K219 Gastro-esophageal reflux disease without esophagitis: Secondary | ICD-10-CM

## 2018-05-14 DIAGNOSIS — M797 Fibromyalgia: Secondary | ICD-10-CM

## 2018-05-14 DIAGNOSIS — Z6835 Body mass index (BMI) 35.0-35.9, adult: Secondary | ICD-10-CM

## 2018-05-14 DIAGNOSIS — Z13220 Encounter for screening for lipoid disorders: Secondary | ICD-10-CM

## 2018-05-14 DIAGNOSIS — R252 Cramp and spasm: Secondary | ICD-10-CM

## 2018-05-14 DIAGNOSIS — E042 Nontoxic multinodular goiter: Secondary | ICD-10-CM

## 2018-05-14 MED ORDER — METHOCARBAMOL 500 MG PO TABS: 500.0000 mg | ORAL_TABLET | Freq: Three times a day (TID) | ORAL | 1 refills | Status: DC | PRN

## 2018-05-14 MED ORDER — PHENTERMINE-TOPIRAMATE ER 7.5-46 MG PO CP24: 1.0000 | ORAL_CAPSULE | Freq: Every morning | ORAL | 0 refills | Status: DC

## 2018-05-14 MED ORDER — VARENICLINE TARTRATE 0.5 MG X 11 & 1 MG X 42 PO MISC: ORAL | 0 refills | Status: DC

## 2018-05-14 MED ORDER — DULOXETINE HCL 60 MG PO CPEP: 60.0000 mg | ORAL_CAPSULE | Freq: Every day | ORAL | 3 refills | Status: DC

## 2018-05-14 MED ORDER — OMEPRAZOLE 40 MG PO CPDR: 40.0000 mg | DELAYED_RELEASE_CAPSULE | Freq: Every day | ORAL | 3 refills | Status: DC

## 2018-05-14 MED ORDER — CELECOXIB 200 MG PO CAPS: 200.0000 mg | ORAL_CAPSULE | Freq: Two times a day (BID) | ORAL | 3 refills | Status: DC

## 2018-05-14 NOTE — Patient Instructions (Signed)

## 2018-05-14 NOTE — Progress Notes (Signed)
Subjective:     Becky Gibson is a 48 y.o. female and is here for a comprehensive physical exam. The patient reports no problems.  Pt is doing pretty good. She has lost 10lbs on her own. She is working part time. She continues to have fibromyalgia flares but as she keeps moving and losing weight she feels so much better.   She does wish to stop smoking she is smoking 1 pack a day.   She will have some intermittent dorsal foot cramping. She wonders what could be causing.   Social History   Socioeconomic History  . Marital status: Married    Spouse name: Not on file  . Number of children: 3  . Years of education: college  . Highest education level: Not on file  Occupational History  . Occupation: n/a  Social Needs  . Financial resource strain: Not on file  . Food insecurity:    Worry: Not on file    Inability: Not on file  . Transportation needs:    Medical: Not on file    Non-medical: Not on file  Tobacco Use  . Smoking status: Current Every Day Smoker  . Smokeless tobacco: Never Used  Substance and Sexual Activity  . Alcohol use: No  . Drug use: No  . Sexual activity: Yes    Birth control/protection: None  Lifestyle  . Physical activity:    Days per week: Not on file    Minutes per session: Not on file  . Stress: Not on file  Relationships  . Social connections:    Talks on phone: Not on file    Gets together: Not on file    Attends religious service: Not on file    Active member of club or organization: Not on file    Attends meetings of clubs or organizations: Not on file    Relationship status: Not on file  . Intimate partner violence:    Fear of current or ex partner: Not on file    Emotionally abused: Not on file    Physically abused: Not on file    Forced sexual activity: Not on file  Other Topics Concern  . Not on file  Social History Narrative   Patient lives at home with her family.   Patient is right handed   Patient  Drinks coffee daily   Health  Maintenance  Topic Date Due  . INFLUENZA VACCINE  06/10/2018 (Originally 10/10/2017)  . PAP SMEAR-Modifier  06/29/2021  . TETANUS/TDAP  01/30/2023  . HIV Screening  Completed    The following portions of the patient's history were reviewed and updated as appropriate: allergies, current medications, past family history, past medical history, past social history, past surgical history and problem list.  Review of Systems Pertinent items noted in HPI and remainder of comprehensive ROS otherwise negative.   Objective:    BP 136/82   Pulse 82   Temp 98.5 F (36.9 C) (Oral)   Ht 5\' 3"  (1.6 m)   Wt 203 lb (92.1 kg)   BMI 35.96 kg/m  General appearance: alert, cooperative, appears stated age and mildly obese Head: Normocephalic, without obvious abnormality, atraumatic Eyes: conjunctivae/corneas clear. PERRL, EOM's intact. Fundi benign. Ears: normal TM's and external ear canals both ears Nose: Nares normal. Septum midline. Mucosa normal. No drainage or sinus tenderness. Throat: lips, mucosa, and tongue normal; teeth and gums normal Neck: no adenopathy, no carotid bruit, no JVD, supple, symmetrical, trachea midline and thyroid not enlarged, symmetric, no tenderness/mass/nodules  Back: symmetric, no curvature. ROM normal. No CVA tenderness. Lungs: clear to auscultation bilaterally Heart: regular rate and rhythm, S1, S2 normal, no murmur, click, rub or gallop Abdomen: soft, non-tender; bowel sounds normal; no masses,  no organomegaly Extremities: extremities normal, atraumatic, no cyanosis or edema Pulses: 2+ and symmetric Skin: Skin color, texture, turgor normal. No rashes or lesions Lymph nodes: Cervical, supraclavicular, and axillary nodes normal. Neurologic: Alert and oriented X 3, normal strength and tone. Normal symmetric reflexes. Normal coordination and gait      Assessment:    Healthy female exam.      Plan:  Marland KitchenMarland KitchenEmmamarie was seen today for annual exam.  Diagnoses and all orders  for this visit:  Routine physical examination -     Lipid Panel w/reflex Direct LDL -     VITAMIN D 25 Hydroxy (Vit-D Deficiency, Fractures) -     Magnesium -     COMPLETE METABOLIC PANEL WITH GFR -     TSH  Fibromyalgia -     DULoxetine (CYMBALTA) 60 MG capsule; Take 1 capsule (60 mg total) by mouth daily. -     celecoxib (CELEBREX) 200 MG capsule; Take 1 capsule (200 mg total) by mouth 2 (two) times daily. -     methocarbamol (ROBAXIN) 500 MG tablet; Take 1 tablet (500 mg total) by mouth 3 (three) times daily as needed. for muscle spams -     VITAMIN D 25 Hydroxy (Vit-D Deficiency, Fractures)  Gastroesophageal reflux disease, esophagitis presence not specified -     omeprazole (PRILOSEC) 40 MG capsule; Take 1 capsule (40 mg total) by mouth daily.  Screening for diabetes mellitus -     COMPLETE METABOLIC PANEL WITH GFR  Screening for lipid disorders -     Lipid Panel w/reflex Direct LDL  Multiple thyroid nodules -     TSH -     US THYROID  Cramping of feet -     Magnesium -     COMPLETE METABOLIC PANEL WITH GFR  Class 2 obesity due to excess calories without serious comorbidity with body mass index (BMI) of 35.0 to 35.9 in adult -     Phentermine-Topiramate 7.5-46 MG CP24; Take 1 tablet by mouth every morning.  Encounter for smoking cessation counseling -     varenicline (CHANTIX STARTING MONTH PAK) 0.5 MG X 11 & 1 MG X 42 tablet; Take one 0.5mg  tablet by mouth once daily for 3 days, then increase to one 0.5mg  tablet twice daily for 3 days, then increase to one 1mg  tablet twice daily.  Other orders -     Discontinue: Phentermine-Topiramate 7.5-46 MG CP24; Take 1 tablet by mouth every morning.  .. Depression screen [facility] 2/9 05/14/2018 06/13/2017  Decreased Interest 0 0  Down, Depressed, Hopeless 0 0  PHQ - 2 Score 0 0  Altered sleeping 0 0  Tired, decreased energy 0 3  Change in appetite 0 0  Feeling bad or failure about yourself  0 0  Trouble concentrating 0 0  Moving  slowly or fidgety/restless 0 0  Suicidal thoughts 0 0  PHQ-9 Score 0 3  Difficult doing work/chores Not difficult at all -   .. Discussed 150 minutes of exercise a week.  Encouraged vitamin D 1000 units and Calcium 1300mg  or 4 servings of dairy a day.  Mammogram ordered.  Pap up to date.  Declined STI screening.  Labs ordered.   Ongoing medications refilled.   Pt has hx of multiple thyroid nodules that  need to be followed. [place] ordered.   Discussed smoking cessation. Started chantix. Coupon card given. Start tapering over next month. Quit date one month after starting.  Follow up in one month.   [name].Discussed low carb diet with 1500 calories and 80g of protein.  Exercising at least 150 minutes a week.  [employer]itness Pal could be a Microbiologist.  Discussed medication options.  Started qsymia.  Follow up in 1 month.      See After Visit Summary for Counseling Recommendations

## 2018-05-19 ENCOUNTER — Encounter: Payer: Self-pay | Admitting: Physician Assistant

## 2018-05-19 DIAGNOSIS — R252 Cramp and spasm: Secondary | ICD-10-CM | POA: Insufficient documentation

## 2018-05-19 DIAGNOSIS — K219 Gastro-esophageal reflux disease without esophagitis: Secondary | ICD-10-CM | POA: Insufficient documentation

## 2018-05-20 ENCOUNTER — Telehealth: Payer: Self-pay | Admitting: Physician Assistant

## 2018-05-20 NOTE — Telephone Encounter (Signed)
Received fax from [employer] that Qsymia was approved from 05/20/2018 through 11/20/2018. Pharmacy notified and forms sent to scan.

## 2019-06-01 ENCOUNTER — Other Ambulatory Visit: Payer: Self-pay | Admitting: Neurology

## 2019-06-01 DIAGNOSIS — M797 Fibromyalgia: Secondary | ICD-10-CM

## 2019-06-01 MED ORDER — DULOXETINE HCL 60 MG PO CPEP: 60.0000 mg | ORAL_CAPSULE | Freq: Every day | ORAL | 0 refills | Status: DC

## 2019-06-23 ENCOUNTER — Other Ambulatory Visit: Payer: Self-pay | Admitting: Neurology

## 2019-06-23 DIAGNOSIS — M797 Fibromyalgia: Secondary | ICD-10-CM

## 2019-06-23 MED ORDER — CELECOXIB 200 MG PO CAPS: 200.0000 mg | ORAL_CAPSULE | Freq: Two times a day (BID) | ORAL | 0 refills | Status: DC

## 2019-08-27 ENCOUNTER — Other Ambulatory Visit: Payer: Self-pay | Admitting: Neurology

## 2019-08-27 DIAGNOSIS — M797 Fibromyalgia: Secondary | ICD-10-CM

## 2019-08-27 MED ORDER — DULOXETINE HCL 60 MG PO CPEP: 60.0000 mg | ORAL_CAPSULE | Freq: Every day | ORAL | 0 refills | Status: DC

## 2019-10-16 ENCOUNTER — Other Ambulatory Visit: Payer: Self-pay | Admitting: Neurology

## 2019-10-16 DIAGNOSIS — M797 Fibromyalgia: Secondary | ICD-10-CM

## 2019-10-16 MED ORDER — DULOXETINE HCL 60 MG PO CPEP: 60.0000 mg | ORAL_CAPSULE | Freq: Every day | ORAL | 0 refills | Status: DC

## 2019-11-18 ENCOUNTER — Encounter: Payer: Managed Care, Other (non HMO) | Admitting: Physician Assistant

## 2019-11-18 ENCOUNTER — Telehealth: Payer: Self-pay | Admitting: Physician Assistant

## 2019-11-18 DIAGNOSIS — M797 Fibromyalgia: Secondary | ICD-10-CM

## 2019-11-18 MED ORDER — DULOXETINE HCL 60 MG PO CPEP: 60.0000 mg | ORAL_CAPSULE | Freq: Every day | ORAL | 0 refills | Status: DC

## 2019-11-18 NOTE — Telephone Encounter (Signed)
Oh no. Sent refill for another month. Let us know if need virtual visit for covid symptoms.

## 2019-11-18 NOTE — Telephone Encounter (Signed)
Patient can not come in due to son being positive for covid. Stated she took her last pill for DULoxetine (CYMBALTA) 60 MG capsule [[id number]]. States she can not come off of this and she needs a refill. Will call back to reschedule her appointment. Is in quarantine for 10 days.

## 2019-12-02 ENCOUNTER — Encounter: Payer: Self-pay | Admitting: Physician Assistant

## 2019-12-02 ENCOUNTER — Other Ambulatory Visit: Payer: Self-pay

## 2019-12-02 ENCOUNTER — Ambulatory Visit (INDEPENDENT_AMBULATORY_CARE_PROVIDER_SITE_OTHER): Payer: Managed Care, Other (non HMO) | Admitting: Physician Assistant

## 2019-12-02 VITALS — BP 125/68 | HR 84 | Wt 209.0 lb

## 2019-12-02 DIAGNOSIS — Z1211 Encounter for screening for malignant neoplasm of colon: Secondary | ICD-10-CM

## 2019-12-02 DIAGNOSIS — Z0000 Encounter for general adult medical examination without abnormal findings: Secondary | ICD-10-CM | POA: Diagnosis not present

## 2019-12-02 DIAGNOSIS — K219 Gastro-esophageal reflux disease without esophagitis: Secondary | ICD-10-CM

## 2019-12-02 DIAGNOSIS — F17200 Nicotine dependence, unspecified, uncomplicated: Secondary | ICD-10-CM

## 2019-12-02 DIAGNOSIS — R7989 Other specified abnormal findings of blood chemistry: Secondary | ICD-10-CM

## 2019-12-02 DIAGNOSIS — Z131 Encounter for screening for diabetes mellitus: Secondary | ICD-10-CM

## 2019-12-02 DIAGNOSIS — E559 Vitamin D deficiency, unspecified: Secondary | ICD-10-CM

## 2019-12-02 DIAGNOSIS — M797 Fibromyalgia: Secondary | ICD-10-CM

## 2019-12-02 DIAGNOSIS — Z23 Encounter for immunization: Secondary | ICD-10-CM

## 2019-12-02 DIAGNOSIS — N951 Menopausal and female climacteric states: Secondary | ICD-10-CM

## 2019-12-02 DIAGNOSIS — Z1231 Encounter for screening mammogram for malignant neoplasm of breast: Secondary | ICD-10-CM

## 2019-12-02 DIAGNOSIS — Z1159 Encounter for screening for other viral diseases: Secondary | ICD-10-CM

## 2019-12-02 DIAGNOSIS — Z13220 Encounter for screening for lipoid disorders: Secondary | ICD-10-CM

## 2019-12-02 DIAGNOSIS — E538 Deficiency of other specified B group vitamins: Secondary | ICD-10-CM

## 2019-12-02 MED ORDER — METHOCARBAMOL 500 MG PO TABS: 500.0000 mg | ORAL_TABLET | Freq: Three times a day (TID) | ORAL | 1 refills | Status: DC | PRN

## 2019-12-02 MED ORDER — PANTOPRAZOLE SODIUM 40 MG PO TBEC: 40.0000 mg | DELAYED_RELEASE_TABLET | Freq: Every day | ORAL | 3 refills | Status: DC

## 2019-12-02 MED ORDER — DULOXETINE HCL 60 MG PO CPEP: 60.0000 mg | ORAL_CAPSULE | Freq: Every day | ORAL | 1 refills | Status: DC

## 2019-12-02 MED ORDER — CELECOXIB 200 MG PO CAPS: 200.0000 mg | ORAL_CAPSULE | Freq: Two times a day (BID) | ORAL | 1 refills | Status: DC

## 2019-12-02 NOTE — Patient Instructions (Addendum)
Will get US thyroid Will get labs.  Order mammogram Ordered colonoscopy.    Health Maintenance, Female Adopting a healthy lifestyle and getting preventive care are important in promoting health and wellness. Ask your health care provider about:  The right schedule for you to have regular tests and exams.  Things you can do on your own to prevent diseases and keep yourself healthy. What should I know about diet, weight, and exercise? Eat a healthy diet   Eat a diet that includes plenty of vegetables, fruits, low-fat dairy products, and lean protein.  Do not eat a lot of foods that are high in solid fats, added sugars, or sodium. Maintain a healthy weight Body mass index (BMI) is used to identify weight problems. It estimates body fat based on height and weight. Your health care provider can help determine your BMI and help you achieve or maintain a healthy weight. Get regular exercise Get regular exercise. This is one of the most important things you can do for your health. Most adults should:  Exercise for at least 150 minutes each week. The exercise should increase your heart rate and make you sweat (moderate-intensity exercise).  Do strengthening exercises at least twice a week. This is in addition to the moderate-intensity exercise.  Spend less time sitting. Even light physical activity can be beneficial. Watch cholesterol and blood lipids Have your blood tested for lipids and cholesterol at 51 years of age, then have this test every 5 years. Have your cholesterol levels checked more often if:  Your lipid or cholesterol levels are high.  You are older than 51 years of age.  You are at high risk for heart disease. What should I know about cancer screening? Depending on your health history and family history, you may need to have cancer screening at various ages. This may include screening for:  Breast cancer.  Cervical cancer.  Colorectal cancer.  Skin cancer.  Lung  cancer. What should I know about heart disease, diabetes, and high blood pressure? Blood pressure and heart disease  High blood pressure causes heart disease and increases the risk of stroke. This is more likely to develop in people who have high blood pressure readings, are of African descent, or are overweight.  Have your blood pressure checked: ? Every 3-5 years if you are 37-79 years of age. ? Every year if you are 27 years old or older. Diabetes Have regular diabetes screenings. This checks your fasting blood sugar level. Have the screening done:  Once every three years after age 55 if you are at a normal weight and have a low risk for diabetes.  More often and at a younger age if you are overweight or have a high risk for diabetes. What should I know about preventing infection? Hepatitis B If you have a higher risk for hepatitis B, you should be screened for this virus. Talk with your health care provider to find out if you are at risk for hepatitis B infection. Hepatitis C Testing is recommended for:  Everyone born from [birth year] through 1967.  Anyone with known risk factors for hepatitis C. Sexually transmitted infections (STIs)  Get screened for STIs, including gonorrhea and chlamydia, if: ? You are sexually active and are younger than 51 years of age. ? You are older than 51 years of age and your health care provider tells you that you are at risk for this type of infection. ? Your sexual activity has changed since you were last screened, and  you are at increased risk for chlamydia or gonorrhea. Ask your health care provider if you are at risk.  Ask your health care provider about whether you are at high risk for HIV. Your health care provider may recommend a prescription medicine to help prevent HIV infection. If you choose to take medicine to prevent HIV, you should first get tested for HIV. You should then be tested every 3 months for as long as you are taking the  medicine. Pregnancy  If you are about to stop having your period (premenopausal) and you may become pregnant, seek counseling before you get pregnant.  Take 400 to 800 micrograms (mcg) of folic acid every day if you become pregnant.  Ask for birth control (contraception) if you want to prevent pregnancy. Osteoporosis and menopause Osteoporosis is a disease in which the bones lose minerals and strength with aging. This can result in bone fractures. If you are 38 years old or older, or if you are at risk for osteoporosis and fractures, ask your health care provider if you should:  Be screened for bone loss.  Take a calcium or vitamin D supplement to lower your risk of fractures.  Be given hormone replacement therapy (HRT) to treat symptoms of menopause. Follow these instructions at home: Lifestyle  Do not use any products that contain nicotine or tobacco, such as cigarettes, e-cigarettes, and chewing tobacco. If you need help quitting, ask your health care provider.  Do not use street drugs.  Do not share needles.  Ask your health care provider for help if you need support or information about quitting drugs. Alcohol use  Do not drink alcohol if: ? Your health care provider tells you not to drink. ? You are pregnant, may be pregnant, or are planning to become pregnant.  If you drink alcohol: ? Limit how much you use to 0-1 drink a day. ? Limit intake if you are breastfeeding.  Be aware of how much alcohol is in your drink. In the [place] one drink equals one 12 oz bottle of beer (355 mL), one 5 oz glass of wine (148 mL), or one 1 oz glass of hard liquor (44 mL). General instructions  Schedule regular health, dental, and eye exams.  Stay current with your vaccines.  Tell your health care provider if: ? You often feel depressed. ? You have ever been abused or do not feel safe at home. Summary  Adopting a healthy lifestyle and getting preventive care are important in  promoting health and wellness.  Follow your health care provider's instructions about healthy diet, exercising, and getting tested or screened for diseases.  Follow your health care provider's instructions on monitoring your cholesterol and blood pressure. This information is not intended to replace advice given to you by your health care provider. Make sure you discuss any questions you have with your health care provider. Document Revised: 02/19/2018 Document Reviewed: 02/19/2018 Elsevier Patient Education  2020 [employer].

## 2019-12-02 NOTE — Progress Notes (Signed)
Subjective:     Becky Gibson is a 49 y.o. female and is here for a comprehensive physical exam. The patient reports problems - see below.    Overall patient is doing really well.  She is happy in her job and life.  She has no major concerns.  Her fibromyalgia and pain are controlled with medication.  She is just hoping to stay healthy.  She does continue to smoke.  Social History   Socioeconomic History  . Marital status: Married    Spouse name: Not on file  . Number of children: 3  . Years of education: college  . Highest education level: Not on file  Occupational History  . Occupation: n/a  Tobacco Use  . Smoking status: Current Every Day Smoker  . Smokeless tobacco: Never Used  Substance and Sexual Activity  . Alcohol use: No  . Drug use: No  . Sexual activity: Yes    Birth control/protection: None  Other Topics Concern  . Not on file  Social History Narrative   Patient lives at home with her family.   Patient is right handed   Patient  Drinks coffee daily   Social Determinants of Health   Financial Resource Strain:   . Difficulty of Paying Living Expenses: Not on file  Food Insecurity:   . Worried About Charity fundraiser in the Last Year: Not on file  . Ran Out of Food in the Last Year: Not on file  Transportation Needs:   . Lack of Transportation (Medical): Not on file  . Lack of Transportation (Non-Medical): Not on file  Physical Activity:   . Days of Exercise per Week: Not on file  . Minutes of Exercise per Session: Not on file  Stress:   . Feeling of Stress : Not on file  Social Connections:   . Frequency of Communication with Friends and Family: Not on file  . Frequency of Social Gatherings with Friends and Family: Not on file  . Attends Religious Services: Not on file  . Active Member of Clubs or Organizations: Not on file  . Attends Archivist Meetings: Not on file  . Marital Status: Not on file  Intimate Partner Violence:   . Fear of  Current or Ex-Partner: Not on file  . Emotionally Abused: Not on file  . Physically Abused: Not on file  . Sexually Abused: Not on file   Health Maintenance  Topic Date Due  . MAMMOGRAM  07/04/2017  . COLONOSCOPY  Never done  . PAP SMEAR-Modifier  06/29/2021  . TETANUS/TDAP  01/30/2023  . INFLUENZA VACCINE  Completed  . COVID-19 Vaccine  Completed  . Hepatitis C Screening  Completed  . HIV Screening  Completed    The following portions of the patient's history were reviewed and updated as appropriate: allergies, current medications, past family history, past medical history, past social history, past surgical history and problem list.  Review of Systems Pertinent items noted in HPI and remainder of comprehensive ROS otherwise negative.   Objective:    BP 125/68   Pulse 84   Wt 209 lb (94.8 kg)   SpO2 96%   BMI 37.02 kg/m  General appearance: alert, cooperative, appears stated age and moderately obese Head: Normocephalic, without obvious abnormality, atraumatic Eyes: conjunctivae/corneas clear. PERRL, EOM's intact. Fundi benign. Ears: normal TM's and external ear canals both ears Nose: Nares normal. Septum midline. Mucosa normal. No drainage or sinus tenderness. Throat: lips, mucosa, and tongue normal; teeth and  gums normal Neck: no adenopathy, no carotid bruit, no JVD, supple, symmetrical, trachea midline and thyroid not enlarged, symmetric, no tenderness/mass/nodules Back: symmetric, no curvature. ROM normal. No CVA tenderness. Lungs: clear to auscultation bilaterally Heart: regular rate and rhythm, S1, S2 normal, no murmur, click, rub or gallop Abdomen: soft, non-tender; bowel sounds normal; no masses,  no organomegaly Extremities: extremities normal, atraumatic, no cyanosis or edema Pulses: 2+ and symmetric Skin: Skin color, texture, turgor normal. No rashes or lesions Lymph nodes: Cervical, supraclavicular, and axillary nodes normal. Neurologic: Alert and oriented X  3, normal strength and tone. Normal symmetric reflexes. Normal coordination and gait   .[name] Depression screen [facility] 2/9 05/14/2018 06/13/2017  Decreased Interest 0 0  Down, Depressed, Hopeless 0 0  PHQ - 2 Score 0 0  Altered sleeping 0 0  Tired, decreased energy 0 3  Change in appetite 0 0  Feeling bad or failure about yourself  0 0  Trouble concentrating 0 0  Moving slowly or fidgety/restless 0 0  Suicidal thoughts 0 0  PHQ-9 Score 0 3  Difficult doing work/chores Not difficult at all -     Assessment:    Healthy female exam.     Plan:    Marland KitchenMarland KitchenDaphnie was seen today for annual exam.  Diagnoses and all orders for this visit:  Routine physical examination -     Ambulatory referral to Gastroenterology -     MM 3D SCREEN BREAST BILATERAL -     Hepatitis C Antibody -     Lipid Panel w/reflex Direct LDL -     COMPLETE METABOLIC PANEL WITH GFR -     TSH -     VITAMIN D 25 Hydroxy (Vit-D Deficiency, Fractures) -     B12 and Folate Panel  Current smoker  Fibromyalgia -     celecoxib (CELEBREX) 200 MG capsule; Take 1 capsule (200 mg total) by mouth 2 (two) times daily. -     DULoxetine (CYMBALTA) 60 MG capsule; Take 1 capsule (60 mg total) by mouth daily. -     methocarbamol (ROBAXIN) 500 MG tablet; Take 1 tablet (500 mg total) by mouth 3 (three) times daily as needed. for muscle spams  Colon cancer screening -     Ambulatory referral to Gastroenterology  Visit for screening mammogram -     MM 3D SCREEN BREAST BILATERAL  Encounter for hepatitis C screening test for low risk patient -     Hepatitis C Antibody  Screening for lipid disorders -     Lipid Panel w/reflex Direct LDL  Screening for diabetes mellitus -     COMPLETE METABOLIC PANEL WITH GFR  Morbid obesity (HCC)  Vitamin D deficiency -     VITAMIN D 25 Hydroxy (Vit-D Deficiency, Fractures)  Low vitamin B12 level -     B12 and Folate Panel  Perimenopausal  Flu vaccine need -     Flu Vaccine QUAD 36+ mos  IM  Need for shingles vaccine -     Varicella-zoster vaccine IM  Gastroesophageal reflux disease, unspecified whether esophagitis present -     pantoprazole (PROTONIX) 40 MG tablet; Take 1 tablet (40 mg total) by mouth daily.   Discussed 150 minutes of exercise a week.  Encouraged vitamin D 1000 units and Calcium 1300mg  or 4 servings of dairy a day.  Fasting labs ordered today.  Pap UTD. Declines STD. Mammogram ordered. Colonoscopy referral made.  Flu/covid/Tdap UTD. Shingles vaccine started today. Follow up in 2 months for  next dose.   Pt declined smoking cessation. She would qualify for low dose CT annual scan. Referral made for screening.    Mood and pain controlled on current medications.         See After Visit Summary for Counseling Recommendations

## 2019-12-03 LAB — TSH: TSH: 0.76 mIU/L

## 2019-12-03 LAB — COMPLETE METABOLIC PANEL WITH GFR
AG Ratio: 2 (calc) (ref 1.0–2.5)
ALT: 17 U/L (ref 6–29)
AST: 12 U/L (ref 10–35)
Albumin: 4.1 g/dL (ref 3.6–5.1)
Alkaline phosphatase (APISO): 84 U/L (ref 37–153)
BUN/Creatinine Ratio: 35 (calc) — ABNORMAL HIGH (ref 6–22)
BUN: 17 mg/dL (ref 7–25)
CO2: 30 mmol/L (ref 20–32)
Calcium: 9.1 mg/dL (ref 8.6–10.4)
Chloride: 105 mmol/L (ref 98–110)
Creat: 0.48 mg/dL — ABNORMAL LOW (ref 0.50–1.05)
GFR, Est African American: 132 mL/min/{1.73_m2} (ref >=60)
GFR, Est Non African American: 114 mL/min/{1.73_m2} (ref >=60)
Globulin: 2.1 g/dL (calc) (ref 1.9–3.7)
Glucose, Bld: 84 mg/dL (ref 65–99)
Potassium: 4.2 mmol/L (ref 3.5–5.3)
Sodium: 141 mmol/L (ref 135–146)
Total Bilirubin: 0.3 mg/dL (ref 0.2–1.2)
Total Protein: 6.2 g/dL (ref 6.1–8.1)

## 2019-12-03 LAB — HEPATITIS C ANTIBODY
Hepatitis C Ab: NONREACTIVE
SIGNAL TO CUT-OFF: 0.01 (ref <1.00)

## 2019-12-03 LAB — LIPID PANEL W/REFLEX DIRECT LDL
Cholesterol: 220 mg/dL — ABNORMAL HIGH (ref <200)
HDL: 39 mg/dL — ABNORMAL LOW (ref >=50)
LDL Cholesterol (Calc): 137 mg/dL (calc) — ABNORMAL HIGH
Non-HDL Cholesterol (Calc): 181 mg/dL (calc) — ABNORMAL HIGH (ref <130)
Total CHOL/HDL Ratio: 5.6 (calc) — ABNORMAL HIGH (ref <5.0)
Triglycerides: 310 mg/dL — ABNORMAL HIGH (ref <150)

## 2019-12-03 LAB — B12 AND FOLATE PANEL
Folate: 5.5 ng/mL
Vitamin B-12: 241 pg/mL (ref 200–1100)

## 2019-12-03 NOTE — Progress Notes (Signed)
Call pt:   Thyroid great.  B12 is still very low. Getting this up could make you feel much better and more energy. We could give you a b12 shot to get you started then take 1031mcg daily.  Kidneys great.  Liver great.  Sugars great.  Your cholesterol is not to goal. Your good cholesterol is low and bad cholesterol is a little high. Your TG are also elevated. Diet and exercise changes could help but with your smoking risk factor this does increase your overall cardiovascular risk. You are borderline on the recommendation to start medication. I would consider at least start fish oil daily 4000mg  and red yeast rice 1200mg  twice a day with limited carbs/sugars/processed foods, get 30 minutes of walking a day and recheck in 1 year.

## 2019-12-08 ENCOUNTER — Encounter: Payer: Self-pay | Admitting: Physician Assistant

## 2019-12-08 DIAGNOSIS — F17200 Nicotine dependence, unspecified, uncomplicated: Secondary | ICD-10-CM | POA: Insufficient documentation

## 2019-12-08 DIAGNOSIS — E538 Deficiency of other specified B group vitamins: Secondary | ICD-10-CM | POA: Insufficient documentation

## 2019-12-08 DIAGNOSIS — N951 Menopausal and female climacteric states: Secondary | ICD-10-CM | POA: Insufficient documentation

## 2020-01-11 ENCOUNTER — Encounter: Payer: Self-pay | Admitting: Physician Assistant

## 2020-02-02 ENCOUNTER — Encounter: Payer: Self-pay | Admitting: *Deleted

## 2020-02-12 ENCOUNTER — Other Ambulatory Visit: Payer: Self-pay | Admitting: Physician Assistant

## 2020-02-12 DIAGNOSIS — M797 Fibromyalgia: Secondary | ICD-10-CM

## 2020-02-19 ENCOUNTER — Telehealth: Payer: Self-pay | Admitting: Neurology

## 2020-02-19 NOTE — Telephone Encounter (Signed)
Patient left vm stating can't get refill of Celexa. 6 months was sent in September. She will speak with [facility] and let us know if she has continued problems.

## 2020-05-02 ENCOUNTER — Other Ambulatory Visit: Payer: Self-pay | Admitting: Neurology

## 2020-05-02 DIAGNOSIS — M797 Fibromyalgia: Secondary | ICD-10-CM

## 2020-05-02 MED ORDER — DULOXETINE HCL 60 MG PO CPEP: 60.0000 mg | ORAL_CAPSULE | Freq: Every day | ORAL | 0 refills | Status: DC

## 2020-07-30 ENCOUNTER — Other Ambulatory Visit: Payer: Self-pay | Admitting: Physician Assistant

## 2020-07-30 DIAGNOSIS — M797 Fibromyalgia: Secondary | ICD-10-CM

## 2020-08-01 ENCOUNTER — Other Ambulatory Visit: Payer: Self-pay | Admitting: Neurology

## 2020-08-01 DIAGNOSIS — M797 Fibromyalgia: Secondary | ICD-10-CM

## 2020-08-01 MED ORDER — CELECOXIB 200 MG PO CAPS: 200.0000 mg | ORAL_CAPSULE | Freq: Two times a day (BID) | ORAL | 0 refills | Status: DC

## 2020-10-04 ENCOUNTER — Other Ambulatory Visit: Payer: Self-pay | Admitting: Neurology

## 2020-10-04 DIAGNOSIS — M797 Fibromyalgia: Secondary | ICD-10-CM

## 2020-10-04 MED ORDER — DULOXETINE HCL 60 MG PO CPEP: 60.0000 mg | ORAL_CAPSULE | Freq: Every day | ORAL | 0 refills | Status: DC

## 2020-10-24 ENCOUNTER — Other Ambulatory Visit: Payer: Self-pay | Admitting: Physician Assistant

## 2020-10-24 DIAGNOSIS — M797 Fibromyalgia: Secondary | ICD-10-CM

## 2020-10-24 NOTE — Telephone Encounter (Signed)
Called patient made an appt 10/26/2020 @ 1:10- tvt

## 2020-10-24 NOTE — Telephone Encounter (Signed)
Cymbalta sent for 1 week. Needs appt. Please call.

## 2020-10-26 ENCOUNTER — Other Ambulatory Visit: Payer: Self-pay

## 2020-10-26 ENCOUNTER — Ambulatory Visit (INDEPENDENT_AMBULATORY_CARE_PROVIDER_SITE_OTHER): Payer: 59 | Admitting: Physician Assistant

## 2020-10-26 ENCOUNTER — Encounter: Payer: Self-pay | Admitting: Physician Assistant

## 2020-10-26 VITALS — BP 139/76 | HR 88 | Ht 63.0 in | Wt 206.0 lb

## 2020-10-26 DIAGNOSIS — M797 Fibromyalgia: Secondary | ICD-10-CM

## 2020-10-26 DIAGNOSIS — Z122 Encounter for screening for malignant neoplasm of respiratory organs: Secondary | ICD-10-CM

## 2020-10-26 DIAGNOSIS — Z131 Encounter for screening for diabetes mellitus: Secondary | ICD-10-CM

## 2020-10-26 DIAGNOSIS — Z0000 Encounter for general adult medical examination without abnormal findings: Secondary | ICD-10-CM | POA: Diagnosis not present

## 2020-10-26 DIAGNOSIS — Z1329 Encounter for screening for other suspected endocrine disorder: Secondary | ICD-10-CM

## 2020-10-26 DIAGNOSIS — Z13220 Encounter for screening for lipoid disorders: Secondary | ICD-10-CM

## 2020-10-26 DIAGNOSIS — Z800 Family history of malignant neoplasm of digestive organs: Secondary | ICD-10-CM

## 2020-10-26 DIAGNOSIS — Z1211 Encounter for screening for malignant neoplasm of colon: Secondary | ICD-10-CM

## 2020-10-26 DIAGNOSIS — F17200 Nicotine dependence, unspecified, uncomplicated: Secondary | ICD-10-CM

## 2020-10-26 DIAGNOSIS — E559 Vitamin D deficiency, unspecified: Secondary | ICD-10-CM

## 2020-10-26 DIAGNOSIS — Z23 Encounter for immunization: Secondary | ICD-10-CM | POA: Diagnosis not present

## 2020-10-26 DIAGNOSIS — Z1231 Encounter for screening mammogram for malignant neoplasm of breast: Secondary | ICD-10-CM

## 2020-10-26 MED ORDER — CELECOXIB 200 MG PO CAPS: 200.0000 mg | ORAL_CAPSULE | Freq: Two times a day (BID) | ORAL | 3 refills | Status: DC

## 2020-10-26 MED ORDER — DULOXETINE HCL 60 MG PO CPEP: 60.0000 mg | ORAL_CAPSULE | Freq: Every day | ORAL | 3 refills | Status: DC
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
## 2020-12-28 ENCOUNTER — Other Ambulatory Visit: Payer: Self-pay

## 2020-12-28 ENCOUNTER — Ambulatory Visit (INDEPENDENT_AMBULATORY_CARE_PROVIDER_SITE_OTHER): Payer: 59 | Admitting: Physician Assistant

## 2020-12-28 ENCOUNTER — Ambulatory Visit (INDEPENDENT_AMBULATORY_CARE_PROVIDER_SITE_OTHER): Payer: 59

## 2020-12-28 ENCOUNTER — Encounter: Payer: Self-pay | Admitting: Physician Assistant

## 2020-12-28 VITALS — BP 110/68 | HR 88 | Temp 98.2°F | Wt 200.1 lb

## 2020-12-28 DIAGNOSIS — M160 Bilateral primary osteoarthritis of hip: Secondary | ICD-10-CM | POA: Insufficient documentation

## 2020-12-28 DIAGNOSIS — R1031 Right lower quadrant pain: Secondary | ICD-10-CM

## 2020-12-28 DIAGNOSIS — M25551 Pain in right hip: Secondary | ICD-10-CM

## 2020-12-28 DIAGNOSIS — M1611 Unilateral primary osteoarthritis, right hip: Secondary | ICD-10-CM | POA: Insufficient documentation

## 2020-12-28 DIAGNOSIS — G8929 Other chronic pain: Secondary | ICD-10-CM

## 2020-12-28 DIAGNOSIS — Z23 Encounter for immunization: Secondary | ICD-10-CM

## 2020-12-28 MED ORDER — TRAMADOL HCL 50 MG PO TABS: 50.0000 mg | ORAL_TABLET | Freq: Three times a day (TID) | ORAL | 0 refills | Status: DC | PRN

## 2020-12-28 MED ORDER — KETOROLAC TROMETHAMINE 60 MG/2ML IM SOLN
60.0000 mg | Freq: Once | INTRAMUSCULAR | Status: AC
Start: 2020-12-28 — End: 2020-12-28
  Administered 2020-12-28: 60 mg via INTRAMUSCULAR

## 2020-12-28 NOTE — Patient Instructions (Signed)
Hip Exercises Ask your health care provider which exercises are safe for you. Do exercises exactly as told by your health care provider and adjust them as directed. It is normal to feel mild stretching, pulling, tightness, or discomfort as you do these exercises. Stop right away if you feel sudden pain or your pain gets worse. Do not begin these exercises until told by your health care provider. Stretching and range-of-motion exercises These exercises warm up your muscles and joints and improve the movement and flexibility of your hip. These exercises also help to relieve pain, numbness, and tingling. You may be asked to limit your range of motion if you had a hip replacement. Talk to your health care provider about these restrictions. Hamstrings, supine  Lie on your back (supine position). Loop a belt or towel over the ball of your left / right foot. The ball of your foot is on the walking surface, right under your toes. Straighten your left / right knee and slowly pull on the belt or towel to raise your leg until you feel a gentle stretch behind your knee (hamstring). Do not let your knee bend while you do this. Keep your other leg flat on the floor. Hold this position for __________ seconds. Slowly return your leg to the starting position. Repeat __________ times. Complete this exercise __________ times a day. Hip rotation  Lie on your back on a firm surface. With your left / right hand, gently pull your left / right knee toward the shoulder that is on the same side of the body. Stop when your knee is pointing toward the ceiling. Hold your left / right ankle with your other hand. Keeping your knee steady, gently pull your left / right ankle toward your other shoulder until you feel a stretch in your buttocks. Keep your hips and shoulders firmly planted while you do this stretch. Hold this position for __________ seconds. Repeat __________ times. Complete this exercise __________ times a  day. Seated stretch This exercise is sometimes called hamstrings and adductors stretch. Sit on the floor with your legs stretched wide. Keep your knees straight during this exercise. Keeping your head and back in a straight line, bend at your waist to reach for your left foot (position A). You should feel a stretch in your right inner thigh (adductors). Hold this position for __________ seconds. Then slowly return to the upright position. Keeping your head and back in a straight line, bend at your waist to reach forward (position B). You should feel a stretch behind both of your thighs and knees (hamstrings). Hold this position for __________ seconds. Then slowly return to the upright position. Keeping your head and back in a straight line, bend at your waist to reach for your right foot (position C). You should feel a stretch in your left inner thigh (adductors). Hold this position for __________ seconds. Then slowly return to the upright position. Repeat __________ times. Complete this exercise __________ times a day. Lunge This exercise stretches the muscles of the hip (hip flexors). Place your left / right knee on the floor and bend your other knee so that is directly over your ankle. You should be half-kneeling. Keep good posture with your head over your shoulders. Tighten your buttocks to point your tailbone downward. This will prevent your back from arching too much. You should feel a gentle stretch in the front of your left / right thigh and hip. If you do not feel a stretch, slide your other foot   forward slightly and then slowly lunge forward with your chest up until your knee once again lines up over your ankle. Make sure your tailbone continues to point downward. Hold this position for __________ seconds. Slowly return to the starting position. Repeat __________ times. Complete this exercise __________ times a day. Strengthening exercises These exercises build strength and endurance  in your hip. Endurance is the ability to use your muscles for a long time, even after they get tired. Bridge This exercise strengthens the muscles of your hip (hip extensors). Lie on your back on a firm surface with your knees bent and your feet flat on the floor. Tighten your buttocks muscles and lift your bottom off the floor until the trunk of your body and your hips are level with your thighs. Do not arch your back. You should feel the muscles working in your buttocks and the back of your thighs. If you do not feel these muscles, slide your feet 1-2 inches (2.5-5 cm) farther away from your buttocks. Hold this position for __________ seconds. Slowly lower your hips to the starting position. Let your muscles relax completely between repetitions. Repeat __________ times. Complete this exercise __________ times a day. Straight leg raises, side-lying This exercise strengthens the muscles that move the hip joint away from the center of the body (hip abductors). Lie on your side with your left / right leg in the top position. Lie so your head, shoulder, hip, and knee line up. You may bend your bottom knee slightly to help you balance. Roll your hips slightly forward, so your hips are stacked directly over each other and your left / right knee is facing forward. Leading with your heel, lift your top leg 4-6 inches (10-15 cm). You should feel the muscles in your top hip lifting. Do not let your foot drift forward. Do not let your knee roll toward the ceiling. Hold this position for __________ seconds. Slowly return to the starting position. Let your muscles relax completely between repetitions. Repeat __________ times. Complete this exercise __________ times a day. Straight leg raises, side-lying This exercise strengthens the muscles that move the hip joint toward the center of the body (hip adductors). Lie on your side with your left / right leg in the bottom position. Lie so your head, shoulder,  hip, and knee line up. You may place your upper foot in front to help you balance. Roll your hips slightly forward, so your hips are stacked directly over each other and your left / right knee is facing forward. Tense the muscles in your inner thigh and lift your bottom leg 4-6 inches (10-15 cm). Hold this position for __________ seconds. Slowly return to the starting position. Let your muscles relax completely between repetitions. Repeat __________ times. Complete this exercise __________ times a day. Straight leg raises, supine This exercise strengthens the muscles in the front of your thigh (quadriceps). Lie on your back (supine position) with your left / right leg extended and your other knee bent. Tense the muscles in the front of your left / right thigh. You should see your kneecap slide up or see increased dimpling just above your knee. Keep these muscles tight as you raise your leg 4-6 inches (10-15 cm) off the floor. Do not let your knee bend. Hold this position for __________ seconds. Keep these muscles tense as you lower your leg. Relax the muscles slowly and completely between repetitions. Repeat __________ times. Complete this exercise __________ times a day. Hip abductors, standing This   exercise strengthens the muscles that move the leg and hip joint away from the center of the body (hip abductors). Tie one end of a rubber exercise band or tubing to a secure surface, such as a chair, table, or pole. Loop the other end of the band or tubing around your left / right ankle. Keeping your ankle with the band or tubing directly opposite the secured end, step away until there is tension in the tubing or band. Hold on to a chair, table, or pole as needed for balance. Lift your left / right leg out to your side. While you do this: Keep your back upright. Keep your shoulders over your hips. Keep your toes pointing forward. Make sure to use your hip muscles to slowly lift your leg. Do not  tip your body or forcefully lift your leg. Hold this position for __________ seconds. Slowly return to the starting position. Repeat __________ times. Complete this exercise __________ times a day. Squats This exercise strengthens the muscles in the front of your thigh (quadriceps). Stand in a door frame so your feet and knees are in line with the frame. You may place your hands on the frame for balance. Slowly bend your knees and lower your hips like you are going to sit in a chair. Keep your lower legs in a straight-up-and-down position. Do not let your hips go lower than your knees. Do not bend your knees lower than told by your health care provider. If your hip pain increases, do not bend as low. Hold this position for ___________ seconds. Slowly push with your legs to return to standing. Do not use your hands to pull yourself to standing. Repeat __________ times. Complete this exercise __________ times a day. This information is not intended to replace advice given to you by your health care provider. Make sure you discuss any questions you have with your health care provider. Document Revised: 10/02/2018 Document Reviewed: 01/07/2018 Elsevier Patient Education  2022 Elsevier Inc.  
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
## 2020-12-29 NOTE — Progress Notes (Signed)
No significant findings on xray. I would like for you to see Dr. Darene Lamer in office before MRI is ordered. I think a bursa injection could be beneficial before getting an MRI.

## 2021-01-02 ENCOUNTER — Ambulatory Visit: Payer: 59 | Admitting: Sports Medicine

## 2021-01-11 ENCOUNTER — Encounter: Payer: Self-pay | Admitting: Sports Medicine

## 2021-01-11 ENCOUNTER — Ambulatory Visit (INDEPENDENT_AMBULATORY_CARE_PROVIDER_SITE_OTHER): Payer: 59 | Admitting: Sports Medicine

## 2021-01-11 DIAGNOSIS — M1611 Unilateral primary osteoarthritis, right hip: Secondary | ICD-10-CM

## 2021-01-11 DIAGNOSIS — M797 Fibromyalgia: Secondary | ICD-10-CM | POA: Diagnosis not present

## 2021-01-11 MED ORDER — DICLOFENAC SODIUM 75 MG PO TBEC: 75.0000 mg | DELAYED_RELEASE_TABLET | Freq: Two times a day (BID) | ORAL | 3 refills | Status: DC

## 2021-01-11 MED ORDER — TRIAZOLAM 0.25 MG PO TABS
ORAL_TABLET | ORAL | 0 refills | Status: DC
Start: 2021-01-11 — End: 2021-08-16

## 2021-01-11 NOTE — Assessment & Plan Note (Signed)
Becky Gibson also has widespread muscular aches and pains, neck, shoulder girdle, hip girdle, back. Pain and tenderness is out of proportion to the degree of palpation. This does likely represent fibromyalgia, we will address this in further detail at a follow-up visit. I do suspect we will either be increasing the Cymbalta or adding Lyrica.

## 2021-01-11 NOTE — Progress Notes (Signed)
    Procedures performed today:    None.  Independent interpretation of notes and tests performed by another provider:   X-rays personally reviewed, I do see some right hip osteoarthritis with squaring off of the femoral head/neck junction.  Brief History, Exam, Impression, and Recommendations:    Primary osteoarthritis of right hip This is a pleasant 50 year old female, she has had a long history of pain in her right hip, groin. Moderate gelling. X-rays did show some osteoarthritis on my interpretation. She has pain with internal rotation that is concordant. We will switch her from Celebrex to Voltaren as she has been on Celebrex for years. Adding hip conditioning exercises. I do suspect we will be proceeding with injection in about 2 weeks so I will also call in some triazolam to help sedate her prior.  Fibromyalgia Mariann also has widespread muscular aches and pains, neck, shoulder girdle, hip girdle, back. Pain and tenderness is out of proportion to the degree of palpation. This does likely represent fibromyalgia, we will address this in further detail at a follow-up visit. I do suspect we will either be increasing the Cymbalta or adding Lyrica.    ___________________________________________ Gwen Her. Dianah Field, M.D., ABFM., CAQSM. Primary Care and [place] Instructor of [place] of [facility] of Medicine

## 2021-01-11 NOTE — Assessment & Plan Note (Signed)
This is a pleasant 50 year old female, she has had a long history of pain in her right hip, groin. Moderate gelling. X-rays did show some osteoarthritis on my interpretation. She has pain with internal rotation that is concordant. We will switch her from Celebrex to Voltaren as she has been on Celebrex for years. Adding hip conditioning exercises. I do suspect we will be proceeding with injection in about 2 weeks so I will also call in some triazolam to help sedate her prior.

## 2021-01-25 ENCOUNTER — Ambulatory Visit (INDEPENDENT_AMBULATORY_CARE_PROVIDER_SITE_OTHER): Payer: 59 | Admitting: Sports Medicine

## 2021-01-25 ENCOUNTER — Other Ambulatory Visit: Payer: Self-pay

## 2021-01-25 ENCOUNTER — Ambulatory Visit: Payer: Self-pay

## 2021-01-25 DIAGNOSIS — M1611 Unilateral primary osteoarthritis, right hip: Secondary | ICD-10-CM | POA: Diagnosis not present

## 2021-01-25 DIAGNOSIS — M797 Fibromyalgia: Secondary | ICD-10-CM

## 2021-01-25 MED ORDER — GABAPENTIN 300 MG PO CAPS
ORAL_CAPSULE | ORAL | 3 refills | Status: DC
Start: 2021-01-25 — End: 2021-11-22

## 2021-01-25 NOTE — Addendum Note (Signed)
Addended by: Silverio Decamp on: 01/25/2021 11:43 AM   Modules accepted: Orders

## 2021-01-25 NOTE — Progress Notes (Signed)
    Procedures performed today:    None.  Independent interpretation of notes and tests performed by another provider:   None.  Brief History, Exam, Impression, and Recommendations:    Fibromyalgia Bonnetta returns, she has widespread muscular aches and pains, I do think she has fibromyalgia, she is currently taking Cymbalta at 60 mg daily, we will leave this alone. We will add gabapentin in an up taper, if this fails we can use Lyrica. She will restart her Celebrex, was unable to tolerate Voltaren due to dyspepsia. Return in a month.    ___________________________________________ Gwen Her. Dianah Field, M.D., ABFM., CAQSM. Primary Care and [place] Instructor of [place] of [facility] of Medicine

## 2021-01-25 NOTE — Assessment & Plan Note (Signed)
Grayson returns, she has widespread muscular aches and pains, I do think she has fibromyalgia, she is currently taking Cymbalta at 60 mg daily, we will leave this alone. We will add gabapentin in an up taper, if this fails we can use Lyrica. She will restart her Celebrex, was unable to tolerate Voltaren due to dyspepsia. Return in a month.

## 2021-02-22 ENCOUNTER — Ambulatory Visit: Payer: 59 | Admitting: Sports Medicine

## 2021-04-03 ENCOUNTER — Encounter: Payer: Self-pay | Admitting: *Deleted

## 2021-07-12 ENCOUNTER — Encounter: Payer: Self-pay | Admitting: Sports Medicine

## 2021-07-12 ENCOUNTER — Ambulatory Visit: Payer: 59 | Admitting: Sports Medicine

## 2021-07-12 ENCOUNTER — Ambulatory Visit (INDEPENDENT_AMBULATORY_CARE_PROVIDER_SITE_OTHER): Payer: 59

## 2021-07-12 DIAGNOSIS — M1611 Unilateral primary osteoarthritis, right hip: Secondary | ICD-10-CM

## 2021-07-12 DIAGNOSIS — M160 Bilateral primary osteoarthritis of hip: Secondary | ICD-10-CM

## 2021-07-12 NOTE — Progress Notes (Signed)
? ? ?  Procedures performed today:   ? ?Procedure: Real-time Ultrasound Guided injection of the left hip joint ?Device: Samsung HS60  ?Verbal informed consent obtained.  ?Time-out conducted.  ?Noted no overlying erythema, induration, or other signs of local infection.  ?Skin prepped in a sterile fashion.  ?Local anesthesia: Topical Ethyl chloride.  ?With sterile technique and under real time ultrasound guidance: Noted minimally arthritic joint, 1 cc Kenalog 40, 2 cc lidocaine, 2 cc bupivacaine injected easily ?Completed without difficulty  ?Advised to call if fevers/chills, erythema, induration, drainage, or persistent bleeding.  ?Images permanently stored and available for review in PACS.  ?Impression: Technically successful ultrasound guided injection. ? ?Procedure: Real-time Ultrasound Guided injection of the right hip joint ?Device: Samsung HS60  ?Verbal informed consent obtained.  ?Time-out conducted.  ?Noted no overlying erythema, induration, or other signs of local infection.  ?Skin prepped in a sterile fashion.  ?Local anesthesia: Topical Ethyl chloride.  ?With sterile technique and under real time ultrasound guidance: Noted minimally arthritic joint, 1 cc Kenalog 40, 2 cc lidocaine, 2 cc bupivacaine injected easily ?Completed without difficulty  ?Advised to call if fevers/chills, erythema, induration, drainage, or persistent bleeding.  ?Images permanently stored and available for review in PACS.  ?Impression: Technically successful ultrasound guided injection. ? ?Independent interpretation of notes and tests performed by another provider:  ? ?None. ? ?Brief History, Exam, Impression, and Recommendations:   ? ?Primary osteoarthritis of both hips ?This is a very pleasant 51 year old female, long history of bilateral hip pain, mostly in the right groin, x-rays did show osteoarthritis, we did add some triazolam for preprocedural anxiolysis and today we proceeded with bilateral hip joint injections with  ultrasound guidance, return to see me in 4 to 6 weeks. ? ? ? ?___________________________________________ ?Gwen Her. Dianah Field, M.D., ABFM., CAQSM. ?Primary Care and Sports Medicine ?[place] ? ?Adjunct Instructor of Family Medicine  ?[employer] ?

## 2021-07-12 NOTE — Assessment & Plan Note (Signed)
This is a very pleasant 51 year old female, long history of bilateral hip pain, mostly in the right groin, x-rays did show osteoarthritis, we did add some triazolam for preprocedural anxiolysis and today we proceeded with bilateral hip joint injections with ultrasound guidance, return to see me in 4 to 6 weeks. ?

## 2021-08-09 ENCOUNTER — Ambulatory Visit: Payer: 59 | Admitting: Sports Medicine

## 2021-08-09 ENCOUNTER — Encounter: Payer: Self-pay | Admitting: Sports Medicine

## 2021-08-09 DIAGNOSIS — F411 Generalized anxiety disorder: Secondary | ICD-10-CM | POA: Diagnosis not present

## 2021-08-09 DIAGNOSIS — M160 Bilateral primary osteoarthritis of hip: Secondary | ICD-10-CM | POA: Diagnosis not present

## 2021-08-09 MED ORDER — ACETAMINOPHEN ER 650 MG PO TBCR: 650.0000 mg | EXTENDED_RELEASE_TABLET | Freq: Three times a day (TID) | ORAL | 3 refills | Status: DC | PRN

## 2021-08-09 NOTE — Assessment & Plan Note (Signed)
Fantastic response after bilateral hip joint injections. She does have some bilateral knee pain. Likely osteoarthritis as well. We discussed adding arthritis from Tylenol twice daily, and we addressed the elephant in the room, her need to lose weight. I would like her to discuss this with her PCP before we consider additional injections, I did suggest GLP-1's over phentermine.

## 2021-08-09 NOTE — Assessment & Plan Note (Signed)
In order to save her joints we do need her to lose some weight, has done well with phentermine in the past, I did advise her that GLP-1's would be a better longer-term solution and she will discuss this with her PCP.

## 2021-08-09 NOTE — Assessment & Plan Note (Addendum)
Described episode where she felt dizzy, panicked, was shaking, had to lay down in bed, talk to her brother on the phone and symptoms improved. She thought this was a heart attack or stroke, explained her that this sounded more like panic. She will discuss this with her PCP.

## 2021-08-09 NOTE — Progress Notes (Signed)
    Procedures performed today:    None.  Independent interpretation of notes and tests performed by another provider:   None.  Brief History, Exam, Impression, and Recommendations:    Primary osteoarthritis of both hips Fantastic response after bilateral hip joint injections. She does have some bilateral knee pain. Likely osteoarthritis as well. We discussed adding arthritis from Tylenol twice daily, and we addressed the elephant in the room, her need to lose weight. I would like her to discuss this with her PCP before we consider additional injections, I did suggest GLP-1's over phentermine.  Morbid obesity ([place]) In order to save her joints we do need her to lose some weight, has done well with phentermine in the past, I did advise her that GLP-1's would be a better longer-term solution and she will discuss this with her PCP.  Generalized anxiety disorder Described episode where she felt dizzy, panicked, was shaking, had to lay down in bed, talk to her brother on the phone and symptoms improved. She thought this was a heart attack or stroke, explained her that this sounded more like panic. She will discuss this with her PCP.   I spent 30 minutes of total time managing this patient today, this includes chart review, face to face, and non-face to face time.  ___________________________________________ Gwen Her. Dianah Field, M.D., ABFM., CAQSM. Primary Care and [place] Instructor of [place] of [facility] of Medicine

## 2021-08-16 ENCOUNTER — Ambulatory Visit (INDEPENDENT_AMBULATORY_CARE_PROVIDER_SITE_OTHER): Payer: 59 | Admitting: Physician Assistant

## 2021-08-16 ENCOUNTER — Encounter: Payer: Self-pay | Admitting: Physician Assistant

## 2021-08-16 VITALS — BP 136/58 | HR 86 | Ht 63.0 in | Wt 201.0 lb

## 2021-08-16 DIAGNOSIS — E6609 Other obesity due to excess calories: Secondary | ICD-10-CM

## 2021-08-16 DIAGNOSIS — Z6835 Body mass index (BMI) 35.0-35.9, adult: Secondary | ICD-10-CM

## 2021-08-16 DIAGNOSIS — M160 Bilateral primary osteoarthritis of hip: Secondary | ICD-10-CM | POA: Diagnosis not present

## 2021-08-16 DIAGNOSIS — F410 Panic disorder [episodic paroxysmal anxiety] without agoraphobia: Secondary | ICD-10-CM

## 2021-08-16 DIAGNOSIS — F419 Anxiety disorder, unspecified: Secondary | ICD-10-CM | POA: Insufficient documentation

## 2021-08-16 MED ORDER — WEGOVY 0.25 MG/0.5ML ~~LOC~~ SOAJ: 0.2500 mg | SUBCUTANEOUS | 0 refills | Status: DC

## 2021-08-16 MED ORDER — HYDROXYZINE HCL 10 MG PO TABS: 10.0000 mg | ORAL_TABLET | Freq: Three times a day (TID) | ORAL | 0 refills | Status: DC | PRN

## 2021-08-16 MED ORDER — WEGOVY 0.5 MG/0.5ML ~~LOC~~ SOAJ
0.5000 mg | SUBCUTANEOUS | 1 refills | Status: DC
Start: 2021-08-16 — End: 2022-02-19
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
## 2021-08-16 NOTE — Patient Instructions (Addendum)
Panic Attack ?A panic attack is a sudden episode of severe anxiety, fear, or discomfort that causes physical and emotional symptoms. A panic attack may be in response to something frightening, or it may occur for no known reason. ?Symptoms of a panic attack can be similar to symptoms of a heart attack or stroke. It is important to see your health care provider when you have a panic attack so that these conditions can be ruled out. ?What are the causes? ?A panic attack may be caused by: ?An extreme, life-threatening situation, such as a war or natural disaster. ?An anxiety disorder, such as post-traumatic stress disorder. ?Depression. ?Panic disorder. ?Certain medical conditions, including heart problems, neurological conditions, and infections. ?Other causes may include: ?Certain over-the-counter and prescription medicines. ?Supplements that increase anxiety. ?Illegal drugs that increase heart rate and blood pressure, such as methamphetamine. ?What increases the risk? ?You are more likely to develop this condition if: ?You have another mental health condition. ?You use alcohol, illegal drugs, or other substances. ?You are under extreme stress. ?A life event is causing increased feelings of anxiety and depression. ?What are the signs or symptoms? ?A panic attack starts suddenly, usually lasts 5-10 minutes, and occurs with one or more of the following: ?A pounding heart, or a feeling that your heart is beating irregularly or faster than normal (palpitations). ?Sweating, trembling, or shaking. ?Shortness of breath, feeling smothered, or feeling choked. ?Chest pain or discomfort. ?Nausea or a strange feeling in your stomach. ?Dizziness, feeling light-headed, or feeling like you might faint. ?Other symptoms may include: ?Chills or hot flashes. ?Numbness or tingling in your lips, hands, or feet. ?Feeling confused, or feeling that you are not yourself. ?Fear of losing control or of being emotionally unstable, or fear of  dying. ?How is this diagnosed? ?A panic attack is diagnosed with an assessment by your health care provider. During the assessment, your health care provider will ask questions about: ?Your history of anxiety, depression, and panic attacks. ?Your medical history. ?Whether you drink alcohol, use drugs, take supplements, or take medicines. Be honest about your substance use. ?Your health care provider may also: ?Order blood tests or other kinds of tests to rule out serious medical conditions. ?Refer you to a mental health professional for further evaluation. ?How is this treated? ?A panic attack is a symptom of another condition. Treatment depends on the cause of the panic attack. ?If the cause is a medical problem, your health care provider will treat that problem or refer you to a specialist. ?If the cause is emotional, you may be given anti-anxiety medicines or referred to a counselor. Anti-anxiety medicines may reduce how often attacks happen, reduce how severe the attacks are, and lower anxiety. ?If the cause is a medicine, your health care provider may tell you to stop the medicine, change your dose, or take a different medicine. ?If the cause is an illegal drug, treatment may involve letting the drug wear off and taking medicine to help the drug leave your body or to stop its effects. Attacks caused by heavy drug use may continue even if you stop using the drug. ?Most panic attacks go away with treatment of the underlying problem. If you have panic attacks often, you may have a condition called panic disorder. ?Follow these instructions at home: ?Alcohol use ?Do not drink alcohol if: ?Your health care provider tells you not to drink. ?You are pregnant, may be pregnant, or are planning to become pregnant. ?If you drink alcohol: ?Limit how much   you have to: 0-1 drink a day for women. 0-2 drinks a day for men. Know how much alcohol is in your drink. In the [place] one drink equals one 12 oz bottle of beer (355  mL), one 5 oz glass of wine (148 mL), or one 1 oz glass of hard liquor (44 mL). General instructions Take over-the-counter and prescription medicines only as told by your health care provider. If you feel anxious, limit your caffeine intake. Take good care of your physical and mental health by: Eating a balanced diet that includes plenty of fresh fruits and vegetables, whole grains, lean meats, and low-fat dairy. Getting plenty of rest. Try to get 7-8 hours of uninterrupted sleep each night. Exercising regularly. Try to get 30 minutes of physical activity at least 5 days a week. Do not use any products that contain nicotine or tobacco. These products include cigarettes, chewing tobacco, and vaping devices, such as e-cigarettes. If you need help quitting, ask your health care provider. Keep all follow-up visits. This is important. Panic attacks may have underlying physical or emotional problems that take time to accurately diagnose. Where to find more information Substance Abuse and [facility]): [email or web address] [place] [facility]): https://carter.com/ Contact a health care provider if: Your symptoms do not improve, or they get worse. You are not able to take your medicine as prescribed because of side effects. Get help right away if: You have thoughts about hurting yourself or others. Get help right away if you feel like you may hurt yourself or others, or have thoughts about taking your own life. Go to your nearest emergency room or: Call 911. Call the [place] at [phone] or [phone]. This is open 24 hours a day. Text the Crisis Text Line at (216) 477-7833. Summary A panic attack is a sudden episode of severe anxiety, fear, or discomfort that causes physical and emotional symptoms. Always see a health care provider to have the reasons for the panic attack correctly diagnosed. If your panic attack was caused by a  physical problem, follow your health care provider's suggestions for medicine, referral to a specialist, and lifestyle changes. If your panic attack was caused by an emotional problem, follow through with counseling from a qualified mental health specialist. If you feel like you may hurt yourself or others, call 911 and get help right away. This information is not intended to replace advice given to you by your health care provider. Make sure you discuss any questions you have with your health care provider. Document Revised: 10/06/2020 Document Reviewed: 10/06/2020 Elsevier Patient Education  Blende Injection (Weight Management) What is this medication? SEMAGLUTIDE (SEM a GLOO tide) promotes weight loss. It may also be used to maintain weight loss. It works by decreasing appetite. Changes to diet and exercise are often combined with this medication. This medicine may be used for other purposes; ask your health care provider or pharmacist if you have questions. COMMON BRAND NAME(S): UTMLYY What should I tell my care team before I take this medication? They need to know if you have any of these conditions: Endocrine tumors (MEN 2) or if someone in your family had these tumors Eye disease, vision problems Gallbladder disease History of depression or mental health disease History of pancreatitis Kidney disease Stomach or intestine problems Suicidal thoughts, plans, or attempt; a previous suicide attempt by you or a family member Thyroid cancer or if someone in your family had thyroid  cancer An unusual or allergic reaction to semaglutide, other medications, foods, dyes, or preservatives Pregnant or trying to get pregnant Breast-feeding How should I use this medication? This medication is injected under the skin. You will be taught how to prepare and give it. Take it as directed on the prescription label. It is given once every week (every 7 days). Keep taking it  unless your care team tells you to stop. It is important that you put your used needles and pens in a special sharps container. Do not put them in a trash can. If you do not have a sharps container, call your pharmacist or care team to get one. A special MedGuide will be given to you by the pharmacist with each prescription and refill. Be sure to read this information carefully each time. This medication comes with INSTRUCTIONS FOR USE. Ask your pharmacist for directions on how to use this medication. Read the information carefully. Talk to your pharmacist or care team if you have questions. Talk to your care team about the use of this medication in children. While it may be prescribed for children as young as 12 years for selected conditions, precautions do apply. Overdosage: If you think you have taken too much of this medicine contact a poison control center or emergency room at once. NOTE: This medicine is only for you. Do not share this medicine with others. What if I miss a dose? If you miss a dose and the next scheduled dose is more than 2 days away, take the missed dose as soon as possible. If you miss a dose and the next scheduled dose is less than 2 days away, do not take the missed dose. Take the next dose at your regular time. Do not take double or extra doses. If you miss your dose for 2 weeks or more, take the next dose at your regular time or call your care team to talk about how to restart this medication. What may interact with this medication? Insulin and other medications for diabetes This list may not describe all possible interactions. Give your health care provider a list of all the medicines, herbs, non-prescription drugs, or dietary supplements you use. Also tell them if you smoke, drink alcohol, or use illegal drugs. Some items may interact with your medicine. What should I watch for while using this medication? Visit your care team for regular checks on your progress. It may be  some time before you see the benefit from this medication. Drink plenty of fluids while taking this medication. Check with your care team if you have severe diarrhea, nausea, and vomiting, or if you sweat a lot. The loss of too much body fluid may make it dangerous for you to take this medication. This medication may affect blood sugar levels. Ask your care team if changes in diet or medications are needed if you have diabetes. If you or your family notice any changes in your behavior, such as new or worsening depression, thoughts of harming yourself, anxiety, other unusual or disturbing thoughts, or memory loss, call your care team right away. Women should inform their care team if they wish to become pregnant or think they might be pregnant. Losing weight while pregnant is not advised and may cause harm to the unborn child. Talk to your care team for more information. What side effects may I notice from receiving this medication? Side effects that you should report to your care team as soon as possible: Allergic reactions--skin rash, itching,  hives, swelling of the face, lips, tongue, or throat Change in vision Dehydration--increased thirst, dry mouth, feeling faint or lightheaded, headache, dark yellow or brown urine Gallbladder problems--severe stomach pain, nausea, vomiting, fever Heart palpitations--rapid, pounding, or irregular heartbeat Kidney injury--decrease in the amount of urine, swelling of the ankles, hands, or feet Pancreatitis--severe stomach pain that spreads to your back or gets worse after eating or when touched, fever, nausea, vomiting Thoughts of suicide or self-harm, worsening mood, feelings of depression Thyroid cancer--new mass or lump in the neck, pain or trouble swallowing, trouble breathing, hoarseness Side effects that usually do not require medical attention (report to your care team if they continue or are bothersome): Diarrhea Loss of appetite Nausea Stomach  pain Vomiting This list may not describe all possible side effects. Call your doctor for medical advice about side effects. You may report side effects to FDA at 1-800-FDA-1088. Where should I keep my medication? Keep out of the reach of children and pets. Refrigeration (preferred): Store in the refrigerator. Do not freeze. Keep this medication in the original container until you are ready to take it. Get rid of any unused medication after the expiration date. Room temperature: If needed, prior to cap removal, the pen can be stored at room temperature for up to 28 days. Protect from light. If it is stored at room temperature, get rid of any unused medication after 28 days or after it expires, whichever is first. It is important to get rid of the medication as soon as you no longer need it or it is expired. You can do this in two ways: Take the medication to a medication take-back program. Check with your pharmacy or law enforcement to find a location. If you cannot return the medication, follow the directions in the [place]. NOTE: This sheet is a summary. It may not cover all possible information. If you have questions about this medicine, talk to your doctor, pharmacist, or health care provider.  2023 Elsevier/Gold Standard (2021-03-15 00:00:00)

## 2021-08-22 ENCOUNTER — Telehealth: Payer: Self-pay

## 2021-08-22 NOTE — Telephone Encounter (Signed)
Initiated Prior authorization XBL:[id number] 0.'25MG'$ /0.5ML auto-injectors Via: Covermymeds Case/Key:[id number] Status: Pending as of 08/22/21 Reason: Notified Pt via: pt does not have Mychart

## 2021-09-26 ENCOUNTER — Other Ambulatory Visit: Payer: Self-pay | Admitting: Physician Assistant

## 2021-09-26 DIAGNOSIS — M160 Bilateral primary osteoarthritis of hip: Secondary | ICD-10-CM

## 2021-09-26 DIAGNOSIS — E6609 Other obesity due to excess calories: Secondary | ICD-10-CM

## 2021-10-11 ENCOUNTER — Encounter: Payer: Self-pay | Admitting: Neurology

## 2021-11-22 ENCOUNTER — Other Ambulatory Visit (HOSPITAL_COMMUNITY)
Admission: RE | Admit: 2021-11-22 | Discharge: 2021-11-22 | Disposition: A | Discharge status: Home / Self Care | Payer: 59 | Source: Ambulatory Visit | Attending: Physician Assistant | Admitting: Physician Assistant

## 2021-11-22 ENCOUNTER — Ambulatory Visit (INDEPENDENT_AMBULATORY_CARE_PROVIDER_SITE_OTHER): Payer: 59 | Admitting: Physician Assistant

## 2021-11-22 ENCOUNTER — Encounter: Payer: Self-pay | Admitting: Physician Assistant

## 2021-11-22 VITALS — BP 133/65 | HR 88 | Wt 186.0 lb

## 2021-11-22 DIAGNOSIS — Z1329 Encounter for screening for other suspected endocrine disorder: Secondary | ICD-10-CM

## 2021-11-22 DIAGNOSIS — Z124 Encounter for screening for malignant neoplasm of cervix: Secondary | ICD-10-CM

## 2021-11-22 DIAGNOSIS — Z13220 Encounter for screening for lipoid disorders: Secondary | ICD-10-CM

## 2021-11-22 DIAGNOSIS — E538 Deficiency of other specified B group vitamins: Secondary | ICD-10-CM | POA: Diagnosis not present

## 2021-11-22 DIAGNOSIS — N3281 Overactive bladder: Secondary | ICD-10-CM

## 2021-11-22 DIAGNOSIS — M160 Bilateral primary osteoarthritis of hip: Secondary | ICD-10-CM

## 2021-11-22 DIAGNOSIS — Z0000 Encounter for general adult medical examination without abnormal findings: Secondary | ICD-10-CM

## 2021-11-22 DIAGNOSIS — E559 Vitamin D deficiency, unspecified: Secondary | ICD-10-CM

## 2021-11-22 DIAGNOSIS — E6609 Other obesity due to excess calories: Secondary | ICD-10-CM

## 2021-11-22 DIAGNOSIS — Z1211 Encounter for screening for malignant neoplasm of colon: Secondary | ICD-10-CM

## 2021-11-22 DIAGNOSIS — Z131 Encounter for screening for diabetes mellitus: Secondary | ICD-10-CM

## 2021-11-22 DIAGNOSIS — Z1231 Encounter for screening mammogram for malignant neoplasm of breast: Secondary | ICD-10-CM

## 2021-11-22 DIAGNOSIS — Z6832 Body mass index (BMI) 32.0-32.9, adult: Secondary | ICD-10-CM

## 2021-11-22 DIAGNOSIS — Z23 Encounter for immunization: Secondary | ICD-10-CM

## 2021-11-22 DIAGNOSIS — M797 Fibromyalgia: Secondary | ICD-10-CM

## 2021-11-22 DIAGNOSIS — L918 Other hypertrophic disorders of the skin: Secondary | ICD-10-CM

## 2021-11-22 MED ORDER — WEGOVY 1 MG/0.5ML ~~LOC~~ SOAJ: 1.0000 mg | SUBCUTANEOUS | 0 refills | Status: DC

## 2021-11-22 MED ORDER — SOLIFENACIN SUCCINATE 5 MG PO TABS: 5.0000 mg | ORAL_TABLET | Freq: Every day | ORAL | 1 refills | Status: DC

## 2021-11-22 MED ORDER — WEGOVY 1.7 MG/0.75ML ~~LOC~~ SOAJ: 1.7000 mg | SUBCUTANEOUS | 0 refills | Status: DC

## 2021-11-22 MED ORDER — WEGOVY 2.4 MG/0.75ML ~~LOC~~ SOAJ: 2.4000 mg | SUBCUTANEOUS | 1 refills | Status: DC

## 2021-11-22 MED ORDER — DULOXETINE HCL 60 MG PO CPEP: 60.0000 mg | ORAL_CAPSULE | Freq: Every day | ORAL | 3 refills | Status: DC
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
## 2021-11-22 NOTE — Patient Instructions (Addendum)
Get mammogram Get colonoscopy  Health Maintenance, Female Adopting a healthy lifestyle and getting preventive care are important in promoting health and wellness. Ask your health care provider about: The right schedule for you to have regular tests and exams. Things you can do on your own to prevent diseases and keep yourself healthy. What should I know about diet, weight, and exercise? Eat a healthy diet  Eat a diet that includes plenty of vegetables, fruits, low-fat dairy products, and lean protein. Do not eat a lot of foods that are high in solid fats, added sugars, or sodium. Maintain a healthy weight Body mass index (BMI) is used to identify weight problems. It estimates body fat based on height and weight. Your health care provider can help determine your BMI and help you achieve or maintain a healthy weight. Get regular exercise Get regular exercise. This is one of the most important things you can do for your health. Most adults should: Exercise for at least 150 minutes each week. The exercise should increase your heart rate and make you sweat (moderate-intensity exercise). Do strengthening exercises at least twice a week. This is in addition to the moderate-intensity exercise. Spend less time sitting. Even light physical activity can be beneficial. Watch cholesterol and blood lipids Have your blood tested for lipids and cholesterol at 53 years of age, then have this test every 5 years. Have your cholesterol levels checked more often if: Your lipid or cholesterol levels are high. You are older than 53 years of age. You are at high risk for heart disease. What should I know about cancer screening? Depending on your health history and family history, you may need to have cancer screening at various ages. This may include screening for: Breast cancer. Cervical cancer. Colorectal cancer. Skin cancer. Lung cancer. What should I know about heart disease, diabetes, and high blood  pressure? Blood pressure and heart disease High blood pressure causes heart disease and increases the risk of stroke. This is more likely to develop in people who have high blood pressure readings or are overweight. Have your blood pressure checked: Every 3-5 years if you are 10-58 years of age. Every year if you are 81 years old or older. Diabetes Have regular diabetes screenings. This checks your fasting blood sugar level. Have the screening done: Once every three years after age 37 if you are at a normal weight and have a low risk for diabetes. More often and at a younger age if you are overweight or have a high risk for diabetes. What should I know about preventing infection? Hepatitis B If you have a higher risk for hepatitis B, you should be screened for this virus. Talk with your health care provider to find out if you are at risk for hepatitis B infection. Hepatitis C Testing is recommended for: Everyone born from [birth year] through 1967. Anyone with known risk factors for hepatitis C. Sexually transmitted infections (STIs) Get screened for STIs, including gonorrhea and chlamydia, if: You are sexually active and are younger than 53 years of age. You are older than 53 years of age and your health care provider tells you that you are at risk for this type of infection. Your sexual activity has changed since you were last screened, and you are at increased risk for chlamydia or gonorrhea. Ask your health care provider if you are at risk. Ask your health care provider about whether you are at high risk for HIV. Your health care provider may recommend a prescription  medicine to help prevent HIV infection. If you choose to take medicine to prevent HIV, you should first get tested for HIV. You should then be tested every 3 months for as long as you are taking the medicine. Pregnancy If you are about to stop having your period (premenopausal) and you may become pregnant, seek counseling before you  get pregnant. Take 400 to 800 micrograms (mcg) of folic acid every day if you become pregnant. Ask for birth control (contraception) if you want to prevent pregnancy. Osteoporosis and menopause Osteoporosis is a disease in which the bones lose minerals and strength with aging. This can result in bone fractures. If you are 14 years old or older, or if you are at risk for osteoporosis and fractures, ask your health care provider if you should: Be screened for bone loss. Take a calcium or vitamin D supplement to lower your risk of fractures. Be given hormone replacement therapy (HRT) to treat symptoms of menopause. Follow these instructions at home: Alcohol use Do not drink alcohol if: Your health care provider tells you not to drink. You are pregnant, may be pregnant, or are planning to become pregnant. If you drink alcohol: Limit how much you have to: 0-1 drink a day. Know how much alcohol is in your drink. In the [place] one drink equals one 12 oz bottle of beer (355 mL), one 5 oz glass of wine (148 mL), or one 1 oz glass of hard liquor (44 mL). Lifestyle Do not use any products that contain nicotine or tobacco. These products include cigarettes, chewing tobacco, and vaping devices, such as e-cigarettes. If you need help quitting, ask your health care provider. Do not use street drugs. Do not share needles. Ask your health care provider for help if you need support or information about quitting drugs. General instructions Schedule regular health, dental, and eye exams. Stay current with your vaccines. Tell your health care provider if: You often feel depressed. You have ever been abused or do not feel safe at home. Summary Adopting a healthy lifestyle and getting preventive care are important in promoting health and wellness. Follow your health care provider's instructions about healthy diet, exercising, and getting tested or screened for diseases. Follow your health care provider's  instructions on monitoring your cholesterol and blood pressure. This information is not intended to replace advice given to you by your health care provider. Make sure you discuss any questions you have with your health care provider. Document Revised: 07/18/2020 Document Reviewed: 07/18/2020 Elsevier Patient Education  [place].   Overactive Bladder, Adult  Overactive bladder is a condition in which a person has a sudden and frequent need to urinate. A person might also leak urine if he or she cannot get to the bathroom fast enough (urinary incontinence). Sometimes, symptoms can interfere with work or social activities. What are the causes? Overactive bladder is associated with poor nerve signals between your bladder and your brain. Your bladder may get the signal to empty before it is full. You may also have very sensitive muscles that make your bladder squeeze too soon. This condition may also be caused by other factors, such as: Medical conditions: Urinary tract infection. Infection of nearby tissues. Prostate enlargement. Bladder stones, inflammation, or tumors. Diabetes. Muscle or nerve weakness, especially from these conditions: A spinal cord injury. Stroke. Multiple sclerosis. Parkinson's disease. Other causes: Surgery on the uterus or urethra. Drinking too much caffeine or alcohol. Certain medicines, especially those that eliminate extra fluid in the  body (diuretics). Constipation. What increases the risk? You may be at greater risk for overactive bladder if you: Are an older adult. Smoke. Are going through menopause. Have prostate problems. Have a neurological disease, such as stroke, dementia, Parkinson's disease, or multiple sclerosis (MS). Eat or drink alcohol, spicy food, caffeine, and other things that irritate the bladder. Are overweight or obese. What are the signs or symptoms? Symptoms of this condition include a sudden, strong urge to urinate. Other  symptoms include: Leaking urine. Urinating 8 or more times a day. Waking up to urinate 2 or more times overnight. How is this diagnosed? This condition may be diagnosed based on: Your symptoms and medical history. A physical exam. Blood or urine tests to check for possible causes, such as infection. You may also need to see a health care provider who specializes in urinary tract problems. This is called a urologist. How is this treated? Treatment for overactive bladder depends on the cause of your condition and whether it is mild or severe. Treatment may include: Bladder training, such as: Learning to control the urge to urinate by following a schedule to urinate at regular intervals. Doing Kegel exercises to strengthen the pelvic floor muscles that support your bladder. Special devices, such as: Biofeedback. This uses sensors to help you become aware of your body's signals. Electrical stimulation. This uses electrodes placed inside the body (implanted) or outside the body. These electrodes send gentle pulses of electricity to strengthen the nerves or muscles that control the bladder. Women may use a plastic device, called a pessary, that fits into the vagina and supports the bladder. Medicines, such as: Antibiotics to treat bladder infection. Antispasmodics to stop the bladder from releasing urine at the wrong time. Tricyclic antidepressants to relax bladder muscles. Injections of botulinum toxin type A directly into the bladder tissue to relax bladder muscles. Surgery, such as: A device may be implanted to help manage the nerve signals that control urination. An electrode may be implanted to stimulate electrical signals in the bladder. A procedure may be done to change the shape of the bladder. This is done only in very severe cases. Follow these instructions at home: Eating and drinking  Make diet or lifestyle changes recommended by your health care provider. These may  include: Drinking fluids throughout the day and not only with meals. Cutting down on caffeine or alcohol. Eating a healthy and balanced diet to prevent constipation. This may include: Choosing foods that are high in fiber, such as beans, whole grains, and fresh fruits and vegetables. Limiting foods that are high in fat and processed sugars, such as fried and sweet foods. Lifestyle  Lose weight if needed. Do not use any products that contain nicotine or tobacco. These include cigarettes, chewing tobacco, and vaping devices, such as e-cigarettes. If you need help quitting, ask your health care provider. General instructions Take over-the-counter and prescription medicines only as told by your health care provider. If you were prescribed an antibiotic medicine, take it as told by your health care provider. Do not stop taking the antibiotic even if you start to feel better. Use any implants or pessary as told by your health care provider. If needed, wear pads to absorb urine leakage. Keep a log to track how much and when you drink, and when you need to urinate. This will help your health care provider monitor your condition. Keep all follow-up visits. This is important. Contact a health care provider if: You have a fever or chills. Your  symptoms do not get better with treatment. Your pain and discomfort get worse. You have more frequent urges to urinate. Get help right away if: You are not able to control your bladder. Summary Overactive bladder refers to a condition in which a person has a sudden and frequent need to urinate. Several conditions may lead to an overactive bladder. Treatment for overactive bladder depends on the cause and severity of your condition. Making lifestyle changes, doing Kegel exercises, keeping a log, and taking medicines can help with this condition. This information is not intended to replace advice given to you by your health care provider. Make sure you discuss  any questions you have with your health care provider. Document Revised: 11/16/2019 Document Reviewed: 11/16/2019 Elsevier Patient Education  [place].

## 2021-11-24 ENCOUNTER — Encounter: Payer: Self-pay | Admitting: Physician Assistant

## 2021-11-24 DIAGNOSIS — R7989 Other specified abnormal findings of blood chemistry: Secondary | ICD-10-CM | POA: Insufficient documentation

## 2021-11-24 NOTE — Progress Notes (Signed)
Idamae,   WBC and hemoglobin look good.  Your TSH is very low. Meaning your active thyroid hormone is on the high side. Will add free t4 and t3 with antibodies. I would also like to get thyroid ultrasound.   LDL better than 1 year ago.  TG better than 1 year ago but still elevated. Make sure taking fish oil and decrease carbs/sugars in diet. Recheck in 6 months.   [name].The 10-year ASCVD risk score (Arnett DK, et al., 2019) is: 2.2%   Values used to calculate the score:     Age: 51 years     Sex: Female     Is Non-Hispanic African American: No     Diabetic: No     Tobacco smoker: No     Systolic Blood Pressure: 329 mmHg     Is BP treated: No     HDL Cholesterol: 42 mg/dL     Total Cholesterol: 189 mg/dL  Liver and kidney look good.    B12 normal but low normal. B12 1075mg daily could help with energy.  Vitamin D much better than 5 years ago but not to goal. Make sure taking 2000units daily.

## 2021-11-27 LAB — CYTOLOGY - PAP
Comment: NEGATIVE
Diagnosis: NEGATIVE
High risk HPV: NEGATIVE

## 2021-11-27 NOTE — Progress Notes (Signed)
Normal cells and negative for HPV. Great news. Next pap 5 years.

## 2021-11-28 ENCOUNTER — Other Ambulatory Visit: Payer: Self-pay | Admitting: Physician Assistant

## 2021-11-28 DIAGNOSIS — R7989 Other specified abnormal findings of blood chemistry: Secondary | ICD-10-CM

## 2021-11-28 NOTE — Progress Notes (Signed)
TSH is decreased with free T3 elevated this is supportive of HYPER thyroidism. I do want to get thyroid ultrasound and I also want to make referral to endocrinology. We could do a trial of medication to help decrease thyroid and see if overall you feel better. Thoughts?

## 2021-11-29 LAB — COMPLETE METABOLIC PANEL WITH GFR
AG Ratio: 1.9 (calc) (ref 1.0–2.5)
ALT: 12 U/L (ref 6–29)
AST: 10 U/L (ref 10–35)
Albumin: 4.3 g/dL (ref 3.6–5.1)
Alkaline phosphatase (APISO): 82 U/L (ref 37–153)
BUN/Creatinine Ratio: 29 (calc) — ABNORMAL HIGH (ref 6–22)
BUN: 12 mg/dL (ref 7–25)
CO2: 29 mmol/L (ref 20–32)
Calcium: 9.6 mg/dL (ref 8.6–10.4)
Chloride: 106 mmol/L (ref 98–110)
Creat: 0.42 mg/dL — ABNORMAL LOW (ref 0.50–1.03)
Globulin: 2.3 g/dL (calc) (ref 1.9–3.7)
Glucose, Bld: 89 mg/dL (ref 65–99)
Potassium: 4.1 mmol/L (ref 3.5–5.3)
Sodium: 142 mmol/L (ref 135–146)
Total Bilirubin: 0.4 mg/dL (ref 0.2–1.2)
Total Protein: 6.6 g/dL (ref 6.1–8.1)
eGFR: 117 mL/min/{1.73_m2} (ref >=60)

## 2021-11-29 LAB — THYROID PEROXIDASE ANTIBODY: Thyroperoxidase Ab SerPl-aCnc: 1 IU/mL (ref <9)

## 2021-11-29 LAB — CBC WITH DIFFERENTIAL/PLATELET
Absolute Monocytes: 450 cells/uL (ref 200–950)
Basophils Absolute: 30 cells/uL (ref 0–200)
Basophils Relative: 0.4 %
Eosinophils Absolute: 210 cells/uL (ref 15–500)
Eosinophils Relative: 2.8 %
HCT: 41 % (ref 35.0–45.0)
Hemoglobin: 13.9 g/dL (ref 11.7–15.5)
Lymphs Abs: 2273 cells/uL (ref 850–3900)
MCH: 28.5 pg (ref 27.0–33.0)
MCHC: 33.9 g/dL (ref 32.0–36.0)
MCV: 84.2 fL (ref 80.0–100.0)
MPV: 8.6 fL (ref 7.5–12.5)
Monocytes Relative: 6 %
Neutro Abs: 4538 cells/uL (ref 1500–7800)
Neutrophils Relative %: 60.5 %
Platelets: 263 10*3/uL (ref 140–400)
RBC: 4.87 10*6/uL (ref 3.80–5.10)
RDW: 12.6 % (ref 11.0–15.0)
Total Lymphocyte: 30.3 %
WBC: 7.5 10*3/uL (ref 3.8–10.8)

## 2021-11-29 LAB — TEST AUTHORIZATION

## 2021-11-29 LAB — T3, FREE: T3, Free: 5.6 pg/mL — ABNORMAL HIGH (ref 2.3–4.2)

## 2021-11-29 LAB — LIPID PANEL W/REFLEX DIRECT LDL
Cholesterol: 189 mg/dL (ref <200)
HDL: 42 mg/dL — ABNORMAL LOW (ref >=50)
LDL Cholesterol (Calc): 112 mg/dL (calc) — ABNORMAL HIGH
Non-HDL Cholesterol (Calc): 147 mg/dL (calc) — ABNORMAL HIGH (ref <130)
Total CHOL/HDL Ratio: 4.5 (calc) (ref <5.0)
Triglycerides: 230 mg/dL — ABNORMAL HIGH (ref <150)

## 2021-11-29 LAB — VITAMIN D 25 HYDROXY (VIT D DEFICIENCY, FRACTURES): Vit D, 25-Hydroxy: 31 ng/mL (ref 30–100)

## 2021-11-29 LAB — T4, FREE: Free T4: 1.6 ng/dL (ref 0.8–1.8)

## 2021-11-29 LAB — TSH: TSH: <0.01 mIU/L — ABNORMAL LOW

## 2021-11-29 LAB — VITAMIN B12: Vitamin B-12: 293 pg/mL (ref 200–1100)

## 2021-12-01 NOTE — Progress Notes (Signed)
Antibodies negative and free t4 normal.

## 2021-12-19 ENCOUNTER — Telehealth: Payer: Self-pay

## 2021-12-19 NOTE — Telephone Encounter (Signed)
[facility] assistance [facility]-  Becky Gibson --[phone] callled states they received a work note asking for accomodation for patient  regarding stooping, kneeling, crouching , and crawling - she just needs to know how long these orders should be in place  for the patient - requesting a call  back.

## 2021-12-19 NOTE — Telephone Encounter (Signed)
Encourage patient to have either herself or her HR department send [place] accommodation forms from their company.  A letter will not suffice anyway.  Usually that is just given to the manager or HR and then they fax [place] paperwork for more detailed information.

## 2021-12-20 NOTE — Telephone Encounter (Signed)
LVM for Raquel Sarna letting her know forms that were completed stated lifetime need. If needs further clarification can call back or send forms to complete.

## 2022-02-16 ENCOUNTER — Telehealth: Payer: Self-pay | Admitting: Physician Assistant

## 2022-02-16 NOTE — Telephone Encounter (Signed)
Patient has an appointment for 02/19/2022 @ 2:40. tvt

## 2022-02-16 NOTE — Telephone Encounter (Signed)
Will address refill at appt since it is in 3 days.

## 2022-02-16 NOTE — Telephone Encounter (Signed)
Please call patient and schedule appt then we can send RX to get her til appt time. Thanks!

## 2022-02-16 NOTE — Telephone Encounter (Signed)
I would recommend that we go ahead and schedule her follow-up appointment first and then we have a better idea of how long she may need the medication to get her through.

## 2022-02-16 NOTE — Telephone Encounter (Signed)
Patient called stated she is about out of her wegovy and needs an appointment can you contact her please.  Thank you

## 2022-02-19 ENCOUNTER — Encounter: Payer: Self-pay | Admitting: Physician Assistant

## 2022-02-19 ENCOUNTER — Ambulatory Visit (INDEPENDENT_AMBULATORY_CARE_PROVIDER_SITE_OTHER): Payer: 59 | Admitting: Physician Assistant

## 2022-02-19 VITALS — BP 119/63 | HR 84 | Ht 63.0 in | Wt 176.0 lb

## 2022-02-19 DIAGNOSIS — R110 Nausea: Secondary | ICD-10-CM | POA: Diagnosis not present

## 2022-02-19 DIAGNOSIS — Z6831 Body mass index (BMI) 31.0-31.9, adult: Secondary | ICD-10-CM | POA: Diagnosis not present

## 2022-02-19 DIAGNOSIS — M160 Bilateral primary osteoarthritis of hip: Secondary | ICD-10-CM | POA: Diagnosis not present

## 2022-02-19 DIAGNOSIS — E6609 Other obesity due to excess calories: Secondary | ICD-10-CM | POA: Diagnosis not present

## 2022-02-19 DIAGNOSIS — Z6832 Body mass index (BMI) 32.0-32.9, adult: Secondary | ICD-10-CM

## 2022-02-19 MED ORDER — WEGOVY 2.4 MG/0.75ML ~~LOC~~ SOAJ: 2.4000 mg | SUBCUTANEOUS | 5 refills | Status: DC

## 2022-02-19 MED ORDER — ONDANSETRON 8 MG PO TBDP: 8.0000 mg | ORAL_TABLET | Freq: Three times a day (TID) | ORAL | 1 refills | Status: AC | PRN
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
## 2022-04-03 ENCOUNTER — Telehealth: Payer: Self-pay

## 2022-04-03 NOTE — Telephone Encounter (Addendum)
Initiated Prior authorization ARW:PTYYPE 0.'25MG'$ /0.5ML auto-injectors Via: Covermymeds Case/Key:[id number] Status: approved  as of 04/03/22 Reason:VALID 04/03/2022 - 10/02/2022 Notified Pt via: Pt does not have Mychart , called  pt, pt does not have a vm set up to leave a message

## 2022-05-16 ENCOUNTER — Other Ambulatory Visit: Payer: Self-pay | Admitting: Neurology

## 2022-08-22 ENCOUNTER — Encounter: Payer: Self-pay | Admitting: Physician Assistant

## 2022-08-22 ENCOUNTER — Ambulatory Visit (INDEPENDENT_AMBULATORY_CARE_PROVIDER_SITE_OTHER): Payer: 59 | Admitting: Physician Assistant

## 2022-08-22 VITALS — BP 136/84 | HR 86 | Ht 63.0 in | Wt 154.0 lb

## 2022-08-22 DIAGNOSIS — Z8639 Personal history of other endocrine, nutritional and metabolic disease: Secondary | ICD-10-CM | POA: Diagnosis not present

## 2022-08-22 DIAGNOSIS — M797 Fibromyalgia: Secondary | ICD-10-CM | POA: Diagnosis not present

## 2022-08-22 DIAGNOSIS — E663 Overweight: Secondary | ICD-10-CM | POA: Diagnosis not present

## 2022-08-22 DIAGNOSIS — M160 Bilateral primary osteoarthritis of hip: Secondary | ICD-10-CM

## 2022-08-22 DIAGNOSIS — N3281 Overactive bladder: Secondary | ICD-10-CM

## 2022-08-22 MED ORDER — WEGOVY 0.5 MG/0.5ML ~~LOC~~ SOAJ: 0.5000 mg | SUBCUTANEOUS | 1 refills | Status: DC

## 2022-08-22 MED ORDER — SOLIFENACIN SUCCINATE 5 MG PO TABS: 5.0000 mg | ORAL_TABLET | Freq: Every day | ORAL | 1 refills | Status: DC
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
## 2022-11-21 ENCOUNTER — Encounter: Payer: 59 | Admitting: Physician Assistant

## 2022-11-21 DIAGNOSIS — M160 Bilateral primary osteoarthritis of hip: Secondary | ICD-10-CM

## 2022-11-26 ENCOUNTER — Other Ambulatory Visit: Payer: Self-pay | Admitting: Physician Assistant

## 2022-11-26 DIAGNOSIS — R7989 Other specified abnormal findings of blood chemistry: Secondary | ICD-10-CM

## 2023-03-04 ENCOUNTER — Other Ambulatory Visit: Payer: Self-pay | Admitting: Physician Assistant

## 2023-03-04 DIAGNOSIS — M797 Fibromyalgia: Secondary | ICD-10-CM

## 2023-04-02 ENCOUNTER — Other Ambulatory Visit: Payer: Self-pay | Admitting: Physician Assistant

## 2023-04-02 DIAGNOSIS — M797 Fibromyalgia: Secondary | ICD-10-CM

## 2023-04-19 ENCOUNTER — Ambulatory Visit (INDEPENDENT_AMBULATORY_CARE_PROVIDER_SITE_OTHER): Payer: 59 | Admitting: Physician Assistant

## 2023-04-19 VITALS — BP 126/70 | HR 87 | Ht 63.0 in | Wt 166.8 lb

## 2023-04-19 DIAGNOSIS — Z23 Encounter for immunization: Secondary | ICD-10-CM | POA: Diagnosis not present

## 2023-04-19 DIAGNOSIS — Z1231 Encounter for screening mammogram for malignant neoplasm of breast: Secondary | ICD-10-CM | POA: Diagnosis not present

## 2023-04-19 DIAGNOSIS — M160 Bilateral primary osteoarthritis of hip: Secondary | ICD-10-CM | POA: Diagnosis not present

## 2023-04-19 DIAGNOSIS — M797 Fibromyalgia: Secondary | ICD-10-CM | POA: Diagnosis not present

## 2023-04-19 DIAGNOSIS — R7989 Other specified abnormal findings of blood chemistry: Secondary | ICD-10-CM

## 2023-04-19 DIAGNOSIS — Z8639 Personal history of other endocrine, nutritional and metabolic disease: Secondary | ICD-10-CM

## 2023-04-19 DIAGNOSIS — Z79899 Other long term (current) drug therapy: Secondary | ICD-10-CM

## 2023-04-19 DIAGNOSIS — M5136 Other intervertebral disc degeneration, lumbar region with discogenic back pain only: Secondary | ICD-10-CM

## 2023-04-19 DIAGNOSIS — E663 Overweight: Secondary | ICD-10-CM

## 2023-04-19 MED ORDER — WEGOVY 0.25 MG/0.5ML ~~LOC~~ SOAJ: 0.2500 mg | SUBCUTANEOUS | 0 refills | Status: AC

## 2023-04-19 MED ORDER — DULOXETINE HCL 30 MG PO CPEP: 30.0000 mg | ORAL_CAPSULE | Freq: Every day | ORAL | 3 refills | Status: AC

## 2023-04-19 MED ORDER — WEGOVY 0.5 MG/0.5ML ~~LOC~~ SOAJ: 0.5000 mg | SUBCUTANEOUS | 0 refills | Status: AC

## 2023-04-19 MED ORDER — DICLOFENAC SODIUM 75 MG PO TBEC: 75.0000 mg | DELAYED_RELEASE_TABLET | Freq: Two times a day (BID) | ORAL | 3 refills | Status: AC
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
## 2023-04-20 LAB — CMP14+EGFR
ALT: 20 [IU]/L (ref 0–32)
AST: 18 [IU]/L (ref 0–40)
Albumin: 4.3 g/dL (ref 3.8–4.9)
Alkaline Phosphatase: 110 [IU]/L (ref 44–121)
BUN/Creatinine Ratio: 20 (ref 9–23)
BUN: 10 mg/dL (ref 6–24)
Bilirubin Total: 0.3 mg/dL (ref 0.0–1.2)
CO2: 24 mmol/L (ref 20–29)
Calcium: 9.7 mg/dL (ref 8.7–10.2)
Chloride: 104 mmol/L (ref 96–106)
Creatinine, Ser: 0.51 mg/dL — ABNORMAL LOW (ref 0.57–1.00)
Globulin, Total: 2.1 g/dL (ref 1.5–4.5)
Glucose: 96 mg/dL (ref 70–99)
Potassium: 4.1 mmol/L (ref 3.5–5.2)
Sodium: 143 mmol/L (ref 134–144)
Total Protein: 6.4 g/dL (ref 6.0–8.5)
eGFR: 111 mL/min/{1.73_m2} (ref >59)

## 2023-04-20 LAB — TSH+FREE T4
Free T4: 1.16 ng/dL (ref 0.82–1.77)
TSH: 0.019 u[IU]/mL — ABNORMAL LOW (ref 0.450–4.500)

## 2023-04-22 ENCOUNTER — Encounter: Payer: Self-pay | Admitting: Physician Assistant

## 2023-04-22 DIAGNOSIS — E0590 Thyrotoxicosis, unspecified without thyrotoxic crisis or storm: Secondary | ICD-10-CM | POA: Insufficient documentation

## 2023-04-22 NOTE — Progress Notes (Signed)
 Becky Gibson,   Kidney and liver look good.  Glucose is normal.  TSH is trending down like HYPER too much thyroid  but your free levels are good. This is subclinical. We do need to watch it and recheck in 3 months.

## 2023-06-01 ENCOUNTER — Other Ambulatory Visit: Payer: Self-pay | Admitting: Physician Assistant

## 2023-06-01 DIAGNOSIS — N3281 Overactive bladder: Secondary | ICD-10-CM

## 2023-11-14 ENCOUNTER — Encounter: Payer: Self-pay | Admitting: Sports Medicine

## 2024-04-13 ENCOUNTER — Other Ambulatory Visit: Payer: Self-pay | Admitting: Physician Assistant

## 2024-04-13 DIAGNOSIS — Z1231 Encounter for screening mammogram for malignant neoplasm of breast: Secondary | ICD-10-CM
